# Patient Record
Sex: Female | Born: 1940 | Race: White | Hispanic: No | Marital: Single | Smoking: Never smoker
Health system: Southern US, Community
[De-identification: ages and names within clinical notes are randomized; demographics above are authoritative.]

## PROBLEM LIST (undated history)

## (undated) DIAGNOSIS — F0390 Unspecified dementia without behavioral disturbance: Secondary | ICD-10-CM

---

## 2011-09-30 DIAGNOSIS — D510 Vitamin B12 deficiency anemia due to intrinsic factor deficiency: Secondary | ICD-10-CM | POA: Diagnosis not present

## 2011-10-18 DIAGNOSIS — R7989 Other specified abnormal findings of blood chemistry: Secondary | ICD-10-CM | POA: Diagnosis not present

## 2011-10-18 DIAGNOSIS — I10 Essential (primary) hypertension: Secondary | ICD-10-CM | POA: Diagnosis not present

## 2011-10-18 DIAGNOSIS — D510 Vitamin B12 deficiency anemia due to intrinsic factor deficiency: Secondary | ICD-10-CM | POA: Diagnosis not present

## 2011-10-28 DIAGNOSIS — D510 Vitamin B12 deficiency anemia due to intrinsic factor deficiency: Secondary | ICD-10-CM | POA: Diagnosis not present

## 2011-11-25 DIAGNOSIS — D510 Vitamin B12 deficiency anemia due to intrinsic factor deficiency: Secondary | ICD-10-CM | POA: Diagnosis not present

## 2011-12-27 DIAGNOSIS — D510 Vitamin B12 deficiency anemia due to intrinsic factor deficiency: Secondary | ICD-10-CM | POA: Diagnosis not present

## 2012-02-15 DIAGNOSIS — I10 Essential (primary) hypertension: Secondary | ICD-10-CM | POA: Diagnosis not present

## 2012-02-15 DIAGNOSIS — M549 Dorsalgia, unspecified: Secondary | ICD-10-CM | POA: Diagnosis not present

## 2012-02-15 DIAGNOSIS — Z1231 Encounter for screening mammogram for malignant neoplasm of breast: Secondary | ICD-10-CM | POA: Diagnosis not present

## 2012-02-15 DIAGNOSIS — D510 Vitamin B12 deficiency anemia due to intrinsic factor deficiency: Secondary | ICD-10-CM | POA: Diagnosis not present

## 2012-02-15 DIAGNOSIS — F411 Generalized anxiety disorder: Secondary | ICD-10-CM | POA: Diagnosis not present

## 2012-02-25 DIAGNOSIS — Z1231 Encounter for screening mammogram for malignant neoplasm of breast: Secondary | ICD-10-CM | POA: Diagnosis not present

## 2012-06-16 DIAGNOSIS — Z79899 Other long term (current) drug therapy: Secondary | ICD-10-CM | POA: Diagnosis not present

## 2012-06-16 DIAGNOSIS — M549 Dorsalgia, unspecified: Secondary | ICD-10-CM | POA: Diagnosis not present

## 2012-06-16 DIAGNOSIS — I10 Essential (primary) hypertension: Secondary | ICD-10-CM | POA: Diagnosis not present

## 2012-06-16 DIAGNOSIS — F411 Generalized anxiety disorder: Secondary | ICD-10-CM | POA: Diagnosis not present

## 2012-12-19 DIAGNOSIS — M549 Dorsalgia, unspecified: Secondary | ICD-10-CM | POA: Diagnosis not present

## 2012-12-19 DIAGNOSIS — I10 Essential (primary) hypertension: Secondary | ICD-10-CM | POA: Diagnosis not present

## 2013-06-20 DIAGNOSIS — I10 Essential (primary) hypertension: Secondary | ICD-10-CM | POA: Diagnosis not present

## 2013-06-20 DIAGNOSIS — M549 Dorsalgia, unspecified: Secondary | ICD-10-CM | POA: Diagnosis not present

## 2013-06-20 DIAGNOSIS — L989 Disorder of the skin and subcutaneous tissue, unspecified: Secondary | ICD-10-CM | POA: Diagnosis not present

## 2013-06-20 DIAGNOSIS — E539 Vitamin B deficiency, unspecified: Secondary | ICD-10-CM | POA: Diagnosis not present

## 2013-06-20 DIAGNOSIS — M47817 Spondylosis without myelopathy or radiculopathy, lumbosacral region: Secondary | ICD-10-CM | POA: Diagnosis not present

## 2013-06-20 DIAGNOSIS — R209 Unspecified disturbances of skin sensation: Secondary | ICD-10-CM | POA: Diagnosis not present

## 2013-07-17 DIAGNOSIS — L578 Other skin changes due to chronic exposure to nonionizing radiation: Secondary | ICD-10-CM | POA: Diagnosis not present

## 2013-07-17 DIAGNOSIS — L570 Actinic keratosis: Secondary | ICD-10-CM | POA: Diagnosis not present

## 2013-08-14 DIAGNOSIS — L821 Other seborrheic keratosis: Secondary | ICD-10-CM | POA: Diagnosis not present

## 2013-08-14 DIAGNOSIS — L723 Sebaceous cyst: Secondary | ICD-10-CM | POA: Diagnosis not present

## 2013-08-20 DIAGNOSIS — H2510 Age-related nuclear cataract, unspecified eye: Secondary | ICD-10-CM | POA: Diagnosis not present

## 2013-10-31 DIAGNOSIS — D510 Vitamin B12 deficiency anemia due to intrinsic factor deficiency: Secondary | ICD-10-CM | POA: Diagnosis not present

## 2013-10-31 DIAGNOSIS — I10 Essential (primary) hypertension: Secondary | ICD-10-CM | POA: Diagnosis not present

## 2013-10-31 DIAGNOSIS — M549 Dorsalgia, unspecified: Secondary | ICD-10-CM | POA: Diagnosis not present

## 2013-10-31 DIAGNOSIS — R197 Diarrhea, unspecified: Secondary | ICD-10-CM | POA: Diagnosis not present

## 2013-10-31 DIAGNOSIS — R634 Abnormal weight loss: Secondary | ICD-10-CM | POA: Diagnosis not present

## 2013-11-01 DIAGNOSIS — D510 Vitamin B12 deficiency anemia due to intrinsic factor deficiency: Secondary | ICD-10-CM | POA: Diagnosis not present

## 2013-11-01 DIAGNOSIS — R634 Abnormal weight loss: Secondary | ICD-10-CM | POA: Diagnosis not present

## 2013-11-01 DIAGNOSIS — I10 Essential (primary) hypertension: Secondary | ICD-10-CM | POA: Diagnosis not present

## 2013-11-01 DIAGNOSIS — R197 Diarrhea, unspecified: Secondary | ICD-10-CM | POA: Diagnosis not present

## 2014-01-22 DIAGNOSIS — M549 Dorsalgia, unspecified: Secondary | ICD-10-CM | POA: Diagnosis not present

## 2014-01-22 DIAGNOSIS — R197 Diarrhea, unspecified: Secondary | ICD-10-CM | POA: Diagnosis not present

## 2014-01-22 DIAGNOSIS — I10 Essential (primary) hypertension: Secondary | ICD-10-CM | POA: Diagnosis not present

## 2014-02-14 DIAGNOSIS — K591 Functional diarrhea: Secondary | ICD-10-CM | POA: Diagnosis not present

## 2014-02-14 DIAGNOSIS — Z1211 Encounter for screening for malignant neoplasm of colon: Secondary | ICD-10-CM | POA: Diagnosis not present

## 2014-02-14 DIAGNOSIS — R634 Abnormal weight loss: Secondary | ICD-10-CM | POA: Diagnosis not present

## 2014-03-07 DIAGNOSIS — D129 Benign neoplasm of anus and anal canal: Secondary | ICD-10-CM | POA: Diagnosis not present

## 2014-03-07 DIAGNOSIS — Z79899 Other long term (current) drug therapy: Secondary | ICD-10-CM | POA: Diagnosis not present

## 2014-03-07 DIAGNOSIS — Z1211 Encounter for screening for malignant neoplasm of colon: Secondary | ICD-10-CM | POA: Diagnosis not present

## 2014-03-07 DIAGNOSIS — F329 Major depressive disorder, single episode, unspecified: Secondary | ICD-10-CM | POA: Diagnosis not present

## 2014-03-07 DIAGNOSIS — K591 Functional diarrhea: Secondary | ICD-10-CM | POA: Diagnosis not present

## 2014-03-07 DIAGNOSIS — D128 Benign neoplasm of rectum: Secondary | ICD-10-CM | POA: Diagnosis not present

## 2014-03-07 DIAGNOSIS — I10 Essential (primary) hypertension: Secondary | ICD-10-CM | POA: Diagnosis not present

## 2014-03-07 DIAGNOSIS — D1330 Benign neoplasm of unspecified part of small intestine: Secondary | ICD-10-CM | POA: Diagnosis not present

## 2014-03-07 DIAGNOSIS — D126 Benign neoplasm of colon, unspecified: Secondary | ICD-10-CM | POA: Diagnosis not present

## 2014-03-07 DIAGNOSIS — F3289 Other specified depressive episodes: Secondary | ICD-10-CM | POA: Diagnosis not present

## 2014-04-11 DIAGNOSIS — K591 Functional diarrhea: Secondary | ICD-10-CM | POA: Diagnosis not present

## 2014-04-11 DIAGNOSIS — Z86010 Personal history of colonic polyps: Secondary | ICD-10-CM | POA: Diagnosis not present

## 2014-04-25 DIAGNOSIS — M549 Dorsalgia, unspecified: Secondary | ICD-10-CM | POA: Diagnosis not present

## 2014-04-25 DIAGNOSIS — I10 Essential (primary) hypertension: Secondary | ICD-10-CM | POA: Diagnosis not present

## 2014-07-29 DIAGNOSIS — I10 Essential (primary) hypertension: Secondary | ICD-10-CM | POA: Diagnosis not present

## 2014-07-29 DIAGNOSIS — M549 Dorsalgia, unspecified: Secondary | ICD-10-CM | POA: Diagnosis not present

## 2014-10-28 DIAGNOSIS — I10 Essential (primary) hypertension: Secondary | ICD-10-CM | POA: Diagnosis not present

## 2014-10-28 DIAGNOSIS — M549 Dorsalgia, unspecified: Secondary | ICD-10-CM | POA: Diagnosis not present

## 2014-10-28 DIAGNOSIS — R634 Abnormal weight loss: Secondary | ICD-10-CM | POA: Diagnosis not present

## 2015-01-28 DIAGNOSIS — I10 Essential (primary) hypertension: Secondary | ICD-10-CM | POA: Diagnosis not present

## 2015-01-28 DIAGNOSIS — M549 Dorsalgia, unspecified: Secondary | ICD-10-CM | POA: Diagnosis not present

## 2015-01-28 DIAGNOSIS — D692 Other nonthrombocytopenic purpura: Secondary | ICD-10-CM | POA: Diagnosis not present

## 2015-03-04 DIAGNOSIS — L989 Disorder of the skin and subcutaneous tissue, unspecified: Secondary | ICD-10-CM | POA: Diagnosis not present

## 2015-03-06 DIAGNOSIS — L570 Actinic keratosis: Secondary | ICD-10-CM | POA: Diagnosis not present

## 2015-03-06 DIAGNOSIS — D485 Neoplasm of uncertain behavior of skin: Secondary | ICD-10-CM | POA: Diagnosis not present

## 2015-03-06 DIAGNOSIS — L578 Other skin changes due to chronic exposure to nonionizing radiation: Secondary | ICD-10-CM | POA: Diagnosis not present

## 2015-03-12 DIAGNOSIS — D0439 Carcinoma in situ of skin of other parts of face: Secondary | ICD-10-CM | POA: Diagnosis not present

## 2015-04-11 DIAGNOSIS — C44622 Squamous cell carcinoma of skin of right upper limb, including shoulder: Secondary | ICD-10-CM | POA: Diagnosis not present

## 2015-04-30 DIAGNOSIS — D0461 Carcinoma in situ of skin of right upper limb, including shoulder: Secondary | ICD-10-CM | POA: Diagnosis not present

## 2015-05-07 DIAGNOSIS — R634 Abnormal weight loss: Secondary | ICD-10-CM | POA: Diagnosis not present

## 2015-05-07 DIAGNOSIS — I10 Essential (primary) hypertension: Secondary | ICD-10-CM | POA: Diagnosis not present

## 2015-05-07 DIAGNOSIS — M549 Dorsalgia, unspecified: Secondary | ICD-10-CM | POA: Diagnosis not present

## 2015-06-30 DIAGNOSIS — R233 Spontaneous ecchymoses: Secondary | ICD-10-CM | POA: Diagnosis not present

## 2015-06-30 DIAGNOSIS — L720 Epidermal cyst: Secondary | ICD-10-CM | POA: Diagnosis not present

## 2015-06-30 DIAGNOSIS — L219 Seborrheic dermatitis, unspecified: Secondary | ICD-10-CM | POA: Diagnosis not present

## 2015-06-30 DIAGNOSIS — L578 Other skin changes due to chronic exposure to nonionizing radiation: Secondary | ICD-10-CM | POA: Diagnosis not present

## 2015-06-30 DIAGNOSIS — D0461 Carcinoma in situ of skin of right upper limb, including shoulder: Secondary | ICD-10-CM | POA: Diagnosis not present

## 2015-06-30 DIAGNOSIS — L570 Actinic keratosis: Secondary | ICD-10-CM | POA: Diagnosis not present

## 2015-08-06 DIAGNOSIS — M549 Dorsalgia, unspecified: Secondary | ICD-10-CM | POA: Diagnosis not present

## 2015-08-06 DIAGNOSIS — I10 Essential (primary) hypertension: Secondary | ICD-10-CM | POA: Diagnosis not present

## 2015-11-14 DIAGNOSIS — J0100 Acute maxillary sinusitis, unspecified: Secondary | ICD-10-CM | POA: Diagnosis not present

## 2015-11-14 DIAGNOSIS — M549 Dorsalgia, unspecified: Secondary | ICD-10-CM | POA: Diagnosis not present

## 2015-11-14 DIAGNOSIS — I10 Essential (primary) hypertension: Secondary | ICD-10-CM | POA: Diagnosis not present

## 2015-11-14 DIAGNOSIS — R634 Abnormal weight loss: Secondary | ICD-10-CM | POA: Diagnosis not present

## 2016-02-15 DIAGNOSIS — R4020 Unspecified coma: Secondary | ICD-10-CM | POA: Diagnosis not present

## 2016-02-15 DIAGNOSIS — S0181XA Laceration without foreign body of other part of head, initial encounter: Secondary | ICD-10-CM | POA: Diagnosis not present

## 2016-02-15 DIAGNOSIS — S50811A Abrasion of right forearm, initial encounter: Secondary | ICD-10-CM | POA: Diagnosis not present

## 2016-02-15 DIAGNOSIS — S0081XA Abrasion of other part of head, initial encounter: Secondary | ICD-10-CM | POA: Diagnosis not present

## 2016-02-15 DIAGNOSIS — S0990XA Unspecified injury of head, initial encounter: Secondary | ICD-10-CM | POA: Diagnosis not present

## 2016-02-15 DIAGNOSIS — R259 Unspecified abnormal involuntary movements: Secondary | ICD-10-CM | POA: Diagnosis not present

## 2016-03-16 DIAGNOSIS — E538 Deficiency of other specified B group vitamins: Secondary | ICD-10-CM | POA: Diagnosis not present

## 2016-03-16 DIAGNOSIS — I10 Essential (primary) hypertension: Secondary | ICD-10-CM | POA: Diagnosis not present

## 2016-03-16 DIAGNOSIS — R51 Headache: Secondary | ICD-10-CM | POA: Diagnosis not present

## 2016-03-16 DIAGNOSIS — M549 Dorsalgia, unspecified: Secondary | ICD-10-CM | POA: Diagnosis not present

## 2016-03-16 DIAGNOSIS — R634 Abnormal weight loss: Secondary | ICD-10-CM | POA: Diagnosis not present

## 2016-10-03 DIAGNOSIS — S299XXA Unspecified injury of thorax, initial encounter: Secondary | ICD-10-CM | POA: Diagnosis not present

## 2016-10-03 DIAGNOSIS — S81811A Laceration without foreign body, right lower leg, initial encounter: Secondary | ICD-10-CM | POA: Diagnosis not present

## 2016-12-31 DIAGNOSIS — F0390 Unspecified dementia without behavioral disturbance: Secondary | ICD-10-CM | POA: Diagnosis not present

## 2016-12-31 DIAGNOSIS — R402441 Other coma, without documented Glasgow coma scale score, or with partial score reported, in the field [EMT or ambulance]: Secondary | ICD-10-CM | POA: Diagnosis not present

## 2017-02-22 DIAGNOSIS — R402441 Other coma, without documented Glasgow coma scale score, or with partial score reported, in the field [EMT or ambulance]: Secondary | ICD-10-CM | POA: Diagnosis not present

## 2017-02-22 DIAGNOSIS — N3000 Acute cystitis without hematuria: Secondary | ICD-10-CM | POA: Diagnosis not present

## 2017-02-22 DIAGNOSIS — F0390 Unspecified dementia without behavioral disturbance: Secondary | ICD-10-CM | POA: Diagnosis not present

## 2017-02-22 DIAGNOSIS — R4182 Altered mental status, unspecified: Secondary | ICD-10-CM | POA: Diagnosis not present

## 2017-02-22 DIAGNOSIS — R531 Weakness: Secondary | ICD-10-CM | POA: Diagnosis not present

## 2017-03-11 ENCOUNTER — Emergency Department (HOSPITAL_COMMUNITY): Payer: Medicare Other

## 2017-03-11 ENCOUNTER — Inpatient Hospital Stay (HOSPITAL_COMMUNITY)
Admission: EM | Admit: 2017-03-11 | Discharge: 2017-03-18 | DRG: 064 | Disposition: A | Discharge status: Skilled Nursing Facility | Payer: Medicare Other | Attending: Family Medicine | Admitting: Family Medicine

## 2017-03-11 ENCOUNTER — Encounter (HOSPITAL_COMMUNITY): Payer: Self-pay

## 2017-03-11 DIAGNOSIS — Z66 Do not resuscitate: Secondary | ICD-10-CM | POA: Diagnosis not present

## 2017-03-11 DIAGNOSIS — F0390 Unspecified dementia without behavioral disturbance: Secondary | ICD-10-CM | POA: Diagnosis present

## 2017-03-11 DIAGNOSIS — R402352 Coma scale, best motor response, localizes pain, at arrival to emergency department: Secondary | ICD-10-CM | POA: Diagnosis present

## 2017-03-11 DIAGNOSIS — G464 Cerebellar stroke syndrome: Secondary | ICD-10-CM | POA: Diagnosis not present

## 2017-03-11 DIAGNOSIS — D72829 Elevated white blood cell count, unspecified: Secondary | ICD-10-CM | POA: Diagnosis not present

## 2017-03-11 DIAGNOSIS — E860 Dehydration: Secondary | ICD-10-CM | POA: Diagnosis not present

## 2017-03-11 DIAGNOSIS — R402232 Coma scale, best verbal response, inappropriate words, at arrival to emergency department: Secondary | ICD-10-CM | POA: Diagnosis present

## 2017-03-11 DIAGNOSIS — G8194 Hemiplegia, unspecified affecting left nondominant side: Secondary | ICD-10-CM | POA: Diagnosis present

## 2017-03-11 DIAGNOSIS — I638 Other cerebral infarction: Principal | ICD-10-CM | POA: Diagnosis present

## 2017-03-11 DIAGNOSIS — R29714 NIHSS score 14: Secondary | ICD-10-CM | POA: Diagnosis present

## 2017-03-11 DIAGNOSIS — L538 Other specified erythematous conditions: Secondary | ICD-10-CM | POA: Diagnosis present

## 2017-03-11 DIAGNOSIS — I498 Other specified cardiac arrhythmias: Secondary | ICD-10-CM | POA: Diagnosis not present

## 2017-03-11 DIAGNOSIS — R9401 Abnormal electroencephalogram [EEG]: Secondary | ICD-10-CM | POA: Diagnosis not present

## 2017-03-11 DIAGNOSIS — I10 Essential (primary) hypertension: Secondary | ICD-10-CM | POA: Diagnosis present

## 2017-03-11 DIAGNOSIS — I4891 Unspecified atrial fibrillation: Secondary | ICD-10-CM | POA: Diagnosis not present

## 2017-03-11 DIAGNOSIS — Z515 Encounter for palliative care: Secondary | ICD-10-CM

## 2017-03-11 DIAGNOSIS — I6330 Cerebral infarction due to thrombosis of unspecified cerebral artery: Secondary | ICD-10-CM | POA: Insufficient documentation

## 2017-03-11 DIAGNOSIS — G92 Toxic encephalopathy: Secondary | ICD-10-CM | POA: Diagnosis not present

## 2017-03-11 DIAGNOSIS — E876 Hypokalemia: Secondary | ICD-10-CM | POA: Diagnosis present

## 2017-03-11 DIAGNOSIS — I739 Peripheral vascular disease, unspecified: Secondary | ICD-10-CM | POA: Diagnosis present

## 2017-03-11 DIAGNOSIS — M79602 Pain in left arm: Secondary | ICD-10-CM | POA: Diagnosis not present

## 2017-03-11 DIAGNOSIS — R4781 Slurred speech: Secondary | ICD-10-CM | POA: Diagnosis present

## 2017-03-11 DIAGNOSIS — Z781 Physical restraint status: Secondary | ICD-10-CM

## 2017-03-11 DIAGNOSIS — R633 Feeding difficulties: Secondary | ICD-10-CM | POA: Diagnosis present

## 2017-03-11 DIAGNOSIS — R159 Full incontinence of feces: Secondary | ICD-10-CM | POA: Diagnosis present

## 2017-03-11 DIAGNOSIS — R32 Unspecified urinary incontinence: Secondary | ICD-10-CM | POA: Diagnosis present

## 2017-03-11 DIAGNOSIS — N179 Acute kidney failure, unspecified: Secondary | ICD-10-CM | POA: Diagnosis present

## 2017-03-11 DIAGNOSIS — F1010 Alcohol abuse, uncomplicated: Secondary | ICD-10-CM | POA: Diagnosis present

## 2017-03-11 DIAGNOSIS — X58XXXA Exposure to other specified factors, initial encounter: Secondary | ICD-10-CM | POA: Diagnosis present

## 2017-03-11 DIAGNOSIS — M79662 Pain in left lower leg: Secondary | ICD-10-CM | POA: Diagnosis present

## 2017-03-11 DIAGNOSIS — R402142 Coma scale, eyes open, spontaneous, at arrival to emergency department: Secondary | ICD-10-CM | POA: Diagnosis present

## 2017-03-11 DIAGNOSIS — I639 Cerebral infarction, unspecified: Secondary | ICD-10-CM | POA: Diagnosis not present

## 2017-03-11 DIAGNOSIS — R531 Weakness: Secondary | ICD-10-CM | POA: Diagnosis not present

## 2017-03-11 DIAGNOSIS — R471 Dysarthria and anarthria: Secondary | ICD-10-CM | POA: Diagnosis present

## 2017-03-11 DIAGNOSIS — R4182 Altered mental status, unspecified: Secondary | ICD-10-CM | POA: Diagnosis not present

## 2017-03-11 DIAGNOSIS — I6789 Other cerebrovascular disease: Secondary | ICD-10-CM | POA: Diagnosis not present

## 2017-03-11 DIAGNOSIS — R569 Unspecified convulsions: Secondary | ICD-10-CM | POA: Diagnosis not present

## 2017-03-11 DIAGNOSIS — R410 Disorientation, unspecified: Secondary | ICD-10-CM | POA: Diagnosis not present

## 2017-03-11 DIAGNOSIS — I480 Paroxysmal atrial fibrillation: Secondary | ICD-10-CM | POA: Diagnosis present

## 2017-03-11 DIAGNOSIS — M6282 Rhabdomyolysis: Secondary | ICD-10-CM | POA: Diagnosis not present

## 2017-03-11 DIAGNOSIS — M6281 Muscle weakness (generalized): Secondary | ICD-10-CM | POA: Diagnosis not present

## 2017-03-11 DIAGNOSIS — M25512 Pain in left shoulder: Secondary | ICD-10-CM | POA: Diagnosis present

## 2017-03-11 DIAGNOSIS — S0012XA Contusion of left eyelid and periocular area, initial encounter: Secondary | ICD-10-CM | POA: Diagnosis present

## 2017-03-11 HISTORY — DX: Unspecified dementia, unspecified severity, without behavioral disturbance, psychotic disturbance, mood disturbance, and anxiety: F03.90

## 2017-03-11 LAB — RAPID URINE DRUG SCREEN, HOSP PERFORMED
AMPHETAMINES: NOT DETECTED
BENZODIAZEPINES: NOT DETECTED
Barbiturates: NOT DETECTED
COCAINE: NOT DETECTED
OPIATES: NOT DETECTED
Tetrahydrocannabinol: NOT DETECTED

## 2017-03-11 LAB — COMPREHENSIVE METABOLIC PANEL
ALT: 33 U/L (ref 14–54)
ANION GAP: 14 (ref 5–15)
AST: 70 U/L — ABNORMAL HIGH (ref 15–41)
Albumin: 3.9 g/dL (ref 3.5–5.0)
Alkaline Phosphatase: 68 U/L (ref 38–126)
BUN: 26 mg/dL — ABNORMAL HIGH (ref 6–20)
CHLORIDE: 102 mmol/L (ref 101–111)
CO2: 23 mmol/L (ref 22–32)
Calcium: 9 mg/dL (ref 8.9–10.3)
Creatinine, Ser: 1.31 mg/dL — ABNORMAL HIGH (ref 0.44–1.00)
GFR calc non Af Amer: 38 mL/min — ABNORMAL LOW (ref >60)
GFR, EST AFRICAN AMERICAN: 44 mL/min — AB (ref >60)
Glucose, Bld: 116 mg/dL — ABNORMAL HIGH (ref 65–99)
Potassium: 4.6 mmol/L (ref 3.5–5.1)
SODIUM: 139 mmol/L (ref 135–145)
Total Bilirubin: 2 mg/dL — ABNORMAL HIGH (ref 0.3–1.2)
Total Protein: 7.4 g/dL (ref 6.5–8.1)

## 2017-03-11 LAB — URINALYSIS, ROUTINE W REFLEX MICROSCOPIC
BACTERIA UA: NONE SEEN
Bilirubin Urine: NEGATIVE
Glucose, UA: NEGATIVE mg/dL
KETONES UR: 20 mg/dL — AB
Leukocytes, UA: NEGATIVE
Nitrite: NEGATIVE
PROTEIN: NEGATIVE mg/dL
Specific Gravity, Urine: 1.024 (ref 1.005–1.030)
pH: 5 (ref 5.0–8.0)

## 2017-03-11 LAB — DIFFERENTIAL
BASOS PCT: 0 %
Basophils Absolute: 0 10*3/uL (ref 0.0–0.1)
EOS PCT: 0 %
Eosinophils Absolute: 0 10*3/uL (ref 0.0–0.7)
Lymphocytes Relative: 6 %
Lymphs Abs: 0.8 10*3/uL (ref 0.7–4.0)
MONO ABS: 1.3 10*3/uL — AB (ref 0.1–1.0)
Monocytes Relative: 9 %
NEUTROS ABS: 11.5 10*3/uL — AB (ref 1.7–7.7)
Neutrophils Relative %: 85 %

## 2017-03-11 LAB — CBC
HCT: 44.8 % (ref 36.0–46.0)
Hemoglobin: 14.6 g/dL (ref 12.0–15.0)
MCH: 29.1 pg (ref 26.0–34.0)
MCHC: 32.6 g/dL (ref 30.0–36.0)
MCV: 89.2 fL (ref 78.0–100.0)
PLATELETS: 299 10*3/uL (ref 150–400)
RBC: 5.02 MIL/uL (ref 3.87–5.11)
RDW: 14.2 % (ref 11.5–15.5)
WBC: 13.5 10*3/uL — AB (ref 4.0–10.5)

## 2017-03-11 LAB — I-STAT TROPONIN, ED: Troponin i, poc: 0.01 ng/mL (ref 0.00–0.08)

## 2017-03-11 LAB — APTT: aPTT: 34 seconds (ref 24–36)

## 2017-03-11 LAB — PROTIME-INR
INR: 0.98
PROTHROMBIN TIME: 12.9 s (ref 11.4–15.2)

## 2017-03-11 LAB — ABO/RH: ABO/RH(D): O POS

## 2017-03-11 LAB — MRSA PCR SCREENING: MRSA BY PCR: NEGATIVE

## 2017-03-11 LAB — CK: Total CK: 3142 U/L — ABNORMAL HIGH (ref 38–234)

## 2017-03-11 LAB — TYPE AND SCREEN
ABO/RH(D): O POS
ANTIBODY SCREEN: NEGATIVE

## 2017-03-11 LAB — ETHANOL

## 2017-03-11 MED ORDER — SODIUM CHLORIDE 0.9 % IV SOLN
1000.0000 mL | INTRAVENOUS | Status: DC
  Administered 2017-03-11 – 2017-03-12 (×2): 1000 mL via INTRAVENOUS

## 2017-03-11 MED ORDER — SODIUM CHLORIDE 0.9 % IV BOLUS (SEPSIS)
1000.0000 mL | Freq: Once | INTRAVENOUS | Status: AC
  Administered 2017-03-11: 1000 mL via INTRAVENOUS

## 2017-03-11 NOTE — H&P (Signed)
[facility] Admission History and Physical Service Pager: [phone]  Patient name: Susan Ryan Medical record number: [id number] Date of birth: Dec 30, 1940 Age: 76 y.o. Gender: female  Primary Care Provider: Patient, No Pcp Per Consultants: neurology Code Status: FULL per family on admission  Chief Complaint: AMS  Assessment and Plan: Susan Ryan is a 76 y.o. female presenting with AMS, having been found confused in . PMH is significant for afib.   AMS: etiology unclear, lives alone relatively unsupervised. Moderate to severe dementia. Possible causes for acute loss of consciousness include stroke, seizure, overdose, intoxication, hypotension, medications,infection. Initially the workup revealed head CT with no acute findings but atrophy, ingestion and overdose labs negative, lab suggestive of moderate rhabdomyolysis with elevation in creatinine to 1.31, AST 70, and hemoglobin in her urinalysis. White blood cell count elevated to 13.5, however no previous values, no other signs or symptoms of infection. We'll not treat with antibiotics for now, watch clinically and consider adding if mental status not resolving and no other sources are found. -admit to inpatient, Dr. Ardelia Mems attending  -Consult neurology for possible stroke workup, appreciate recs: MRI and EEG ordered -delirium precautions -Stepdown unit -Every 2 neurochecks  Dementia: patient's dementia is moderate to severe given that she is no longer able to do any ADLs, and requires extensive coaching and hand beginning to eat meals. Suspect prognosis is certainly less than 6 months, attempted to discuss this with family, and son was tearful and reticent. Informed son that outpatient or inpatient hospice may give some resources to accomplish his goal for her dying at home. Explained what a code situation was, and that the patient has a grim prognosis showed a code situation arise, however son seemed overwhelmed  and elected to continue with full code at present with plans to discuss this more. -Delirium precautions -Nothing by mouth pending SLP eval -PT/OT  Afib: patient unable to give past medical history, and son with limited health literacy. Unsure whether patient is on anticoagulation. Rate controlled without any meds. Embolic stroke is certainly a possible source of altered mental status in this patient. -Attempt to obtain further history in a.m.  Skin breakdown: patient with erythema over genital area, likely due to fecal incontinence as well as sedentary lifestyle. -Wound consult  FEN/GI: nothing by mouth pending SLP Prophylaxis: heparin subcutaneous as head CT negative for acute bleed  Disposition: admit to stepdown  History of Present Illness:  Susan Ryan is a 76 y.o. female presenting with acute altered mental status in the setting of severe underlying dementia. Past medical history includes A. Fib and dementia. HPI per son and granddaughter. Husband passed this last December after dementia. At baseline, she talks to people were not present, visual hallucinations; this is worse recently. Son has noticed memory troubles for at least 2-3 years. She lives in the house alone now. She does not drive. Sometimes she calls her son her husband's name. Can't feed herself or dress herself. Eats only with prompting. Last several days she hasn't come out on the porch like she normally does. Candis Musa a lot more over the last 2 weeks. Last seen normal about 5pm yesterday. Found her about noon today slumped over in the chair. Rhythmic pulling on the cable cord with her left hand today after family initially thought she could not move this. No lip smacking. Son found her top teeth on the floor. DIL tried to move her and she started screaming as if she was in pain. Recently started on  seroquel from another ED (son thinks maybe for behavior) last dosed seroquel day before yesterday. Found her in stool today. She has  incontinence at baseline x several months. Never found her slumped over before.   Review Of Systems: Per HPI with the following additions: patient unable to provide any history due to altered mental status  ROS  Patient Active Problem List   Diagnosis Date Noted  . Altered mental status 03/11/2017    Past Medical History: Past Medical History:  Diagnosis Date  . Dementia     Past Surgical History: History reviewed. No pertinent surgical history.  Social History: Social History  Substance Use Topics  . Smoking status: Never Smoker  . Smokeless tobacco: Never Used  . Alcohol use No   Additional social history: [place], son lives nearby and checks on her multiple times per day.  Please also refer to relevant sections of EMR.  Family History: History reviewed. No pertinent family history. Husband recently passed of dementia  Allergies and Medications: No Known Allergies No current facility-administered medications on file prior to encounter.    No current outpatient prescriptions on file prior to encounter.    Objective: BP (!) 142/83   Pulse 86   Temp 97.9 F (36.6 C) (Oral)   Resp (!) 22   Ht 5\' 2"  (1.575 m) Comment: per family  Wt 99 lb 6.8 oz (45.1 kg)   SpO2 97%   BMI 18.19 kg/m  Exam: General: cachectic female, opens eyes to verbal stimuli Eyes: EOMI ENTM: dry MM, patient unable to cooperate Neck: supple Cardiovascular: irregular rhythm, normal rate, no murmur Respiratory: CTAB, easy WOB  Gastrointestinal: SNTND, +BS MSK: spontaneously moves all extremities Derm: erythema without open wounds over perineum and buttocks Neuro: unable to assess - patient opens eyes to verbal stimuli, attempts to squeeze finger Psych: unable to assess  Labs and Imaging: CBC BMET   Recent Labs Lab 03/11/17 1722  WBC 13.5*  HGB 14.6  HCT 44.8  PLT 299    Recent Labs Lab 03/11/17 1722  NA 139  K 4.6  CL 102  CO2 23  BUN 26*   CREATININE 1.31*  GLUCOSE 116*  CALCIUM 9.0     UDS neg. Blood culture x 2 ordered. Ethanol neg. INR 0.98, WBC 13.5, Hgb 14.6, Na 139, K 4.6, BUN 26, Cr 1.31, AST 70, ALT 33, tbili 2.0, CK 3142, trop 0.01, UA amber, w Hgb, ketones, squams. CXR poor study nothing acute. CT head with atrophy, nothing acute. EKG afib with rate 88.   Sela Hilding, MD 03/12/2017, 1:18 AM PGY-1, [facility] pager: [phone], text pages welcome

## 2017-03-11 NOTE — ED Provider Notes (Signed)
[facility] DEPT Provider Note   CSN: [id number] Arrival date & time: 03/11/17  1540   By signing my name below, I, Susan Ryan, attest that this documentation has been prepared under the direction and in the presence of Dorie Rank, MD. Electronically signed, Susan Ryan, ED Scribe. 03/11/17. 3:56 PM.   History   Chief Complaint Chief Complaint  Patient presents with  . Stroke Symptoms   LEVEL 5 CAVEAT: HPI and ROS limited due to dementia   The history is provided by medical records and the EMS personnel (Nursing staff in the ED). No language interpreter was used.    Susan Ryan is a 76 y.o. 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 Triage states: "LSN last night at 1700. Pt completely flaccid on the left side. Pt has dementia at baseline. Currently taking cipro, not sure what for. Pt covered in feces and vomit on arrival." Family unavailable for history; nursing staff states the pt lives on the same property in a separate house from family. Pt responds to verbal and physical stimuli and commands in a low, muffled grumble.  Past Medical History:  Diagnosis Date  . Dementia     There are no active problems to display for this patient.   No past surgical history on file.  OB History    No data available       Home Medications    Prior to Admission medications   Not on File    Family History No family history on file.  Social History Social History  Substance Use Topics  . Smoking status: Not on file  . Smokeless tobacco: Not on file  . Alcohol use Not on file     Allergies   Patient has no known allergies.   Review of Systems Review of Systems  Unable to perform ROS: Dementia     Physical Exam Updated Vital Signs BP (!) 151/56   Pulse 78   Temp 100.1 F (37.8 C) (Rectal)   Resp 18   SpO2 100%   Physical Exam  Constitutional: No distress.  Frail, elderly, disheveled,  poor hygeine  HENT:  Head: Normocephalic and atraumatic.  Right Ear: External ear normal.  Left Ear: External ear normal.  Mm dry  Eyes: Conjunctivae are normal. Right eye exhibits no discharge. Left eye exhibits no discharge. No scleral icterus.  Neck: Neck supple. No tracheal deviation present.  Cardiovascular: Normal rate, regular rhythm and intact distal pulses.   Pulmonary/Chest: Effort normal and breath sounds normal. No stridor. No respiratory distress. She has no wheezes. She has no rales.  Abdominal: Soft. Bowel sounds are normal. She exhibits no distension. There is no tenderness. There is no rebound and no guarding.  Musculoskeletal: She exhibits no edema or tenderness.  Neurological: She is alert. No cranial nerve deficit (no facial droop, ) or sensory deficit. She exhibits normal muscle tone. She displays no seizure activity. GCS eye subscore is 4. GCS verbal subscore is 3. GCS motor subscore is 5.  Pt mumbles, not speaking clearly, hard to understand although does answer to voice, generalized weakness, does not follow commands to move extremities  Skin: Skin is warm and dry. No rash noted.  Covered in stool, erythematous rash in the perineum, superficial skin breakdown  Psychiatric: She has a normal mood and affect.  Nursing note and vitals reviewed.    ED Treatments / Results  DIAGNOSTIC STUDIES: Oxygen Saturation is 98% on RA,  NL by my interpretation.    COORDINATION OF CARE: 3:52 PM-Will review records and form care plan.   Labs (all labs ordered are listed, but only abnormal results are displayed) Labs Reviewed  CBC - Abnormal; Notable for the following:       Result Value   WBC 13.5 (*)    All other components within normal limits  DIFFERENTIAL - Abnormal; Notable for the following:    Neutro Abs 11.5 (*)    Monocytes Absolute 1.3 (*)    All other components within normal limits  COMPREHENSIVE METABOLIC PANEL - Abnormal; Notable for the following:     Glucose, Bld 116 (*)    BUN 26 (*)    Creatinine, Ser 1.31 (*)    AST 70 (*)    Total Bilirubin 2.0 (*)    GFR calc non Af Amer 38 (*)    GFR calc Af Amer 44 (*)    All other components within normal limits  URINALYSIS, ROUTINE W REFLEX MICROSCOPIC - Abnormal; Notable for the following:    Color, Urine AMBER (*)    Hgb urine dipstick SMALL (*)    Ketones, ur 20 (*)    Squamous Epithelial / LPF 0-5 (*)    All other components within normal limits  CK - Abnormal; Notable for the following:    Total CK 3,142 (*)    All other components within normal limits  URINE CULTURE  CULTURE, BLOOD (ROUTINE X 2)  CULTURE, BLOOD (ROUTINE X 2)  ETHANOL  PROTIME-INR  APTT  RAPID URINE DRUG SCREEN, HOSP PERFORMED  I-STAT TROPOININ, ED  TYPE AND SCREEN  ABO/RH    EKG  EKG Interpretation  Date/Time:  Friday March 11 2017 17:03:14 EDT Ventricular Rate:  88 PR Interval:    QRS Duration: 84 QT Interval:  393 QTC Calculation: 476 R Axis:   53 Text Interpretation:  sinus rhythm Nonspecific T abnormalities, anterior leads No old tracing to compare Reconfirmed by Dorie Rank [phone]) on 03/11/2017 5:30:43 PM       Radiology Ct Head Wo Contrast  Result Date: 03/11/2017 CLINICAL DATA:  Left-sided weakness concerning for stroke. EXAM: CT HEAD WITHOUT CONTRAST TECHNIQUE: Contiguous axial images were obtained from the base of the skull through the vertex without intravenous contrast. COMPARISON:  None. FINDINGS: Brain: Mild diffuse cortical atrophy is noted. Mild chronic ischemic white matter disease is noted. No mass effect or midline shift is noted. Ventricular size is within normal limits. There is no evidence of mass lesion, hemorrhage or acute infarction. Vascular: No hyperdense vessel or unexpected calcification. Skull: Normal. Negative for fracture or focal lesion. Sinuses/Orbits: No acute finding. Other: None. IMPRESSION: Mild diffuse cortical atrophy. Mild chronic ischemic white matter disease. No  acute intracranial abnormality seen. Electronically Signed   By: Marijo Conception, M.D.   On: 03/11/2017 16:33   Dg Chest Portable 1 View  Result Date: 03/11/2017 CLINICAL DATA:  Weakness EXAM: PORTABLE CHEST 1 VIEW COMPARISON:  None. FINDINGS: The patient is rotated significantly to the left which limits the study. Suspect mild cardiomegaly. No definite confluent opacity, effusion or edema. No acute bony abnormality. IMPRESSION: Study limited by patient rotation. Cardiomegaly. No definite acute process. Electronically Signed   By: Rolm Baptise M.D.   On: 03/11/2017 17:41    Procedures Procedures (including critical care time)  Medications Ordered in ED Medications  sodium chloride 0.9 % bolus 1,000 mL (1,000 mLs Intravenous New Bag/Given 03/11/17 1751)    Followed by  0.9 %  sodium chloride infusion (not administered)     Initial Impression / Assessment and Plan / ED Course  I have reviewed the triage vital signs and the nursing notes.  Pertinent labs & imaging results that were available during my care of the patient were reviewed by me and considered in my medical decision making (see chart for details).  Clinical Course as of Mar 11 1853  Fri Mar 11, 2017  1724 EKG   [JK]    Clinical Course User Index [JK] Dorie Rank, MD    Pt presented with confusion, weakness.  Unclear what her baseline is.   Pt was covered in stool and EMS was concerned about her living situation.  Labs notable for elevated CK.  Consistent with rhabdo.  No definite infection found.  Concerned about the possibility of stroke.  Will consult with medical service for admission, further evaluation.  Final Clinical Impressions(s) / ED Diagnoses   Final diagnoses:  Dehydration  Non-traumatic rhabdomyolysis  Weakness    I personally performed the services described in this documentation, which was scribed in my presence.  The recorded information has been reviewed and is accurate.    Dorie Rank, MD 03/11/17  [phone]

## 2017-03-11 NOTE — ED Notes (Signed)
Admitting Provider at bedside. 

## 2017-03-11 NOTE — Plan of Care (Addendum)
Problem: Education: Goal: Knowledge of [facility] Education information/materials will improve Outcome: Progressing Discussed with patient (with no teach back displayed at this time) and with family (with some teach back displayed)  About plan of care and labs.  Topics: Purewick, IVF, Mouth care, Skin care and lab values

## 2017-03-11 NOTE — Consult Note (Signed)
Reason for Consult: Syncope Referring Physician: Dr. McIntyre  Susan Ryan is an 76 y.o. female.  HPI:  Patient has a history of dementia but living alone at home.  She was found yesterday at 5 pm slumped over the left side onto a chair.  It is not clear how long she had been doing that, but she has rhadbomyolysis with renal failure.  CT Brain shows global atrophy and small vessel ischemic disease.  CXR is normal.  CK 3142, GFR 40,  WBC 13.5 and cultures pending.    Past Medical History:  Diagnosis Date  . Dementia     No past surgical history on file.  No family history on file.  Social History:  has no tobacco, alcohol, and drug history on file.  Allergies: No Known Allergies  Prior to Admission medications   Not on File    Medications: Scheduled:   Results for orders placed or performed during the hospital encounter of 03/11/17 (from the past 48 hour(s))  Urine rapid drug screen (hosp performed)not at [facility]     Status: None   Collection Time: 03/11/17  3:51 PM  Result Value Ref Range   Opiates NONE DETECTED NONE DETECTED   Cocaine NONE DETECTED NONE DETECTED   Benzodiazepines NONE DETECTED NONE DETECTED   Amphetamines NONE DETECTED NONE DETECTED   Tetrahydrocannabinol NONE DETECTED NONE DETECTED   Barbiturates NONE DETECTED NONE DETECTED    Comment:        DRUG SCREEN FOR MEDICAL PURPOSES ONLY.  IF CONFIRMATION IS NEEDED FOR ANY PURPOSE, NOTIFY LAB WITHIN 5 DAYS.        LOWEST DETECTABLE LIMITS FOR URINE DRUG SCREEN Drug Class       Cutoff (ng/mL) Amphetamine      1000 Barbiturate      200 Benzodiazepine   200 Tricyclics       300 Opiates          300 Cocaine          300 THC              50   Ethanol     Status: None   Collection Time: 03/11/17  5:22 PM  Result Value Ref Range   Alcohol, Ethyl (B) <5 <5 mg/dL    Comment:        LOWEST DETECTABLE LIMIT FOR SERUM ALCOHOL IS 5 mg/dL FOR MEDICAL PURPOSES ONLY   Protime-INR     Status: None   Collection  Time: 03/11/17  5:22 PM  Result Value Ref Range   Prothrombin Time 12.9 11.4 - 15.2 seconds   INR 0.98   APTT     Status: None   Collection Time: 03/11/17  5:22 PM  Result Value Ref Range   aPTT 34 24 - 36 seconds  CBC     Status: Abnormal   Collection Time: 03/11/17  5:22 PM  Result Value Ref Range   WBC 13.5 (H) 4.0 - 10.5 K/uL   RBC 5.02 3.87 - 5.11 MIL/uL   Hemoglobin 14.6 12.0 - 15.0 g/dL   HCT 44.8 36.0 - 46.0 %   MCV 89.2 78.0 - 100.0 fL   MCH 29.1 26.0 - 34.0 pg   MCHC 32.6 30.0 - 36.0 g/dL   RDW 14.2 11.5 - 15.5 %   Platelets 299 150 - 400 K/uL  Differential     Status: Abnormal   Collection Time: 03/11/17  5:22 PM  Result Value Ref Range   Neutrophils Relative % 85 %     Neutro Abs 11.5 (H) 1.7 - 7.7 K/uL   Lymphocytes Relative 6 %   Lymphs Abs 0.8 0.7 - 4.0 K/uL   Monocytes Relative 9 %   Monocytes Absolute 1.3 (H) 0.1 - 1.0 K/uL   Eosinophils Relative 0 %   Eosinophils Absolute 0.0 0.0 - 0.7 K/uL   Basophils Relative 0 %   Basophils Absolute 0.0 0.0 - 0.1 K/uL  Comprehensive metabolic panel     Status: Abnormal   Collection Time: 03/11/17  5:22 PM  Result Value Ref Range   Sodium 139 135 - 145 mmol/L   Potassium 4.6 3.5 - 5.1 mmol/L   Chloride 102 101 - 111 mmol/L   CO2 23 22 - 32 mmol/L   Glucose, Bld 116 (H) 65 - 99 mg/dL   BUN 26 (H) 6 - 20 mg/dL   Creatinine, Ser 1.31 (H) 0.44 - 1.00 mg/dL   Calcium 9.0 8.9 - 10.3 mg/dL   Total Protein 7.4 6.5 - 8.1 g/dL   Albumin 3.9 3.5 - 5.0 g/dL   AST 70 (H) 15 - 41 U/L   ALT 33 14 - 54 U/L   Alkaline Phosphatase 68 38 - 126 U/L   Total Bilirubin 2.0 (H) 0.3 - 1.2 mg/dL   GFR calc non Af Amer 38 (L) >60 mL/min   GFR calc Af Amer 44 (L) >60 mL/min    Comment: (NOTE) The eGFR has been calculated using the CKD EPI equation. This calculation has not been validated in all clinical situations. eGFR's persistently <60 mL/min signify possible Chronic Kidney Disease.    Anion gap 14 5 - 15  Type and screen [facility]     Status: None   Collection Time: 03/11/17  5:22 PM  Result Value Ref Range   ABO/RH(D) O POS    Antibody Screen NEG    Sample Expiration 03/14/2017   CK     Status: Abnormal   Collection Time: 03/11/17  5:22 PM  Result Value Ref Range   Total CK 3,142 (H) 38 - 234 U/L  ABO/Rh     Status: None   Collection Time: 03/11/17  5:22 PM  Result Value Ref Range   ABO/RH(D) O POS   I-stat troponin, ED (not at [facility], [facility])     Status: None   Collection Time: 03/11/17  5:33 PM  Result Value Ref Range   Troponin i, poc 0.01 0.00 - 0.08 ng/mL   Comment 3            Comment: Due to the release kinetics of cTnI, a negative result within the first hours of the onset of symptoms does not rule out myocardial infarction with certainty. If myocardial infarction is still suspected, repeat the test at appropriate intervals.   Urinalysis, Routine w reflex microscopic (not at [facility])     Status: Abnormal   Collection Time: 03/11/17  5:58 PM  Result Value Ref Range   Color, Urine AMBER (A) YELLOW    Comment: BIOCHEMICALS MAY BE AFFECTED BY COLOR   APPearance CLEAR CLEAR   Specific Gravity, Urine 1.024 1.005 - 1.030   pH 5.0 5.0 - 8.0   Glucose, UA NEGATIVE NEGATIVE mg/dL   Hgb urine dipstick SMALL (A) NEGATIVE   Bilirubin Urine NEGATIVE NEGATIVE   Ketones, ur 20 (A) NEGATIVE mg/dL   Protein, ur NEGATIVE NEGATIVE mg/dL   Nitrite NEGATIVE NEGATIVE   Leukocytes, UA NEGATIVE NEGATIVE   RBC / HPF 0-5 0 - 5 RBC/hpf     WBC, UA 0-5 0 - 5 WBC/hpf   Bacteria, UA NONE SEEN NONE SEEN   Squamous Epithelial / LPF 0-5 (A) NONE SEEN   Mucous PRESENT    Hyaline Casts, UA PRESENT     Ct Head Wo Contrast  Result Date: 03/11/2017 CLINICAL DATA:  Left-sided weakness concerning for stroke. EXAM: CT HEAD WITHOUT CONTRAST TECHNIQUE: Contiguous axial images were obtained from the base of the skull through the vertex without intravenous contrast. COMPARISON:  None. FINDINGS: Brain: Mild diffuse  cortical atrophy is noted. Mild chronic ischemic white matter disease is noted. No mass effect or midline shift is noted. Ventricular size is within normal limits. There is no evidence of mass lesion, hemorrhage or acute infarction. Vascular: No hyperdense vessel or unexpected calcification. Skull: Normal. Negative for fracture or focal lesion. Sinuses/Orbits: No acute finding. Other: None. IMPRESSION: Mild diffuse cortical atrophy. Mild chronic ischemic white matter disease. No acute intracranial abnormality seen. Electronically Signed   By: James  Green Jr, M.D.   On: 03/11/2017 16:33   Dg Chest Portable 1 View  Result Date: 03/11/2017 CLINICAL DATA:  Weakness EXAM: PORTABLE CHEST 1 VIEW COMPARISON:  None. FINDINGS: The patient is rotated significantly to the left which limits the study. Suspect mild cardiomegaly. No definite confluent opacity, effusion or edema. No acute bony abnormality. IMPRESSION: Study limited by patient rotation. Cardiomegaly. No definite acute process. Electronically Signed   By: Kevin  Dover M.D.   On: 03/11/2017 17:41    ROS Blood pressure (!) 149/70, pulse (!) 106, temperature 98.5 F (36.9 C), temperature source Oral, resp. rate 16, height 5' 2" (1.575 m), weight 45.1 kg (99 lb 6.8 oz), SpO2 100 %. Neurologic Examination:  Awake, alert, disoriented.  Dysarthria.  Cannot answer questions well.  Able to follow commands.    No facial asymmetric.  LUE is bruised and swollen.  She resists movement of the LUE.    LLE also has bruises, but able to move easier.  RUE and RLE are 5/5.    Grimaces and withdraws to pain on the LUE and LLE.  Pinprick testing is unreliable due to dementia.    No babinski.  No hoffman's.      Assessment/Plan:  Due to the dementia, it is difficult to assess whether she truly has left sided weakness since she does not follow well.  In addition, the pain associated with the rhabdomyolysis restricts her left sided movement as she favors it and  therefore cannot tell accurately if truly weak.   Therefore, stroke is possible but need an MRI Brain to confirm.  More likely, however, in my opinion is that she may have had a seizure due to dementia and then lost consciousness leading to the rhabdomyolysis.  I will do an EEG to assess for that.  If MRI is negative for stroke, I would recommend initiation of AED even if EEG does not show active epileptiform discharges.    , , MD 03/11/2017, 10:21 PM  

## 2017-03-11 NOTE — ED Triage Notes (Signed)
[facility]- pt coming from home with stroke sx. LSN last night at 1700. Pt completely flaccid on the left side. Pt has dementia at baseline. Currently taking cipro, not sure what for. Pt covered in feces and vomit on arrival.

## 2017-03-12 ENCOUNTER — Inpatient Hospital Stay (HOSPITAL_COMMUNITY): Payer: Medicare Other

## 2017-03-12 ENCOUNTER — Telehealth: Payer: Self-pay | Admitting: Osteopathic Medicine

## 2017-03-12 DIAGNOSIS — R569 Unspecified convulsions: Secondary | ICD-10-CM

## 2017-03-12 DIAGNOSIS — Z515 Encounter for palliative care: Secondary | ICD-10-CM

## 2017-03-12 DIAGNOSIS — E860 Dehydration: Secondary | ICD-10-CM

## 2017-03-12 DIAGNOSIS — M6282 Rhabdomyolysis: Secondary | ICD-10-CM

## 2017-03-12 DIAGNOSIS — R531 Weakness: Secondary | ICD-10-CM

## 2017-03-12 DIAGNOSIS — R410 Disorientation, unspecified: Secondary | ICD-10-CM

## 2017-03-12 LAB — CBC
HCT: 38.4 % (ref 36.0–46.0)
HEMOGLOBIN: 12.4 g/dL (ref 12.0–15.0)
MCH: 29.2 pg (ref 26.0–34.0)
MCHC: 32.3 g/dL (ref 30.0–36.0)
MCV: 90.4 fL (ref 78.0–100.0)
PLATELETS: 218 10*3/uL (ref 150–400)
RBC: 4.25 MIL/uL (ref 3.87–5.11)
RDW: 14.1 % (ref 11.5–15.5)
WBC: 9 10*3/uL (ref 4.0–10.5)

## 2017-03-12 LAB — URINE CULTURE: CULTURE: NO GROWTH

## 2017-03-12 LAB — COMPREHENSIVE METABOLIC PANEL
ALBUMIN: 2.8 g/dL — AB (ref 3.5–5.0)
ALK PHOS: 42 U/L (ref 38–126)
ALT: 27 U/L (ref 14–54)
ANION GAP: 7 (ref 5–15)
AST: 49 U/L — ABNORMAL HIGH (ref 15–41)
BILIRUBIN TOTAL: 1.5 mg/dL — AB (ref 0.3–1.2)
BUN: 17 mg/dL (ref 6–20)
CALCIUM: 7.9 mg/dL — AB (ref 8.9–10.3)
CO2: 20 mmol/L — ABNORMAL LOW (ref 22–32)
Chloride: 111 mmol/L (ref 101–111)
Creatinine, Ser: 1.02 mg/dL — ABNORMAL HIGH (ref 0.44–1.00)
GFR calc Af Amer: 60 mL/min — ABNORMAL LOW (ref >60)
GFR calc non Af Amer: 52 mL/min — ABNORMAL LOW (ref >60)
GLUCOSE: 104 mg/dL — AB (ref 65–99)
Potassium: 3.3 mmol/L — ABNORMAL LOW (ref 3.5–5.1)
Sodium: 138 mmol/L (ref 135–145)
TOTAL PROTEIN: 5.3 g/dL — AB (ref 6.5–8.1)

## 2017-03-12 LAB — TSH: TSH: 4.686 u[IU]/mL — ABNORMAL HIGH (ref 0.350–4.500)

## 2017-03-12 LAB — VITAMIN B12: VITAMIN B 12: 454 pg/mL (ref 180–914)

## 2017-03-12 LAB — CK: CK TOTAL: 1922 U/L — AB (ref 38–234)

## 2017-03-12 MED ORDER — HEPARIN SODIUM (PORCINE) 5000 UNIT/ML IJ SOLN
5000.0000 [IU] | Freq: Three times a day (TID) | INTRAMUSCULAR | Status: DC
  Administered 2017-03-12 – 2017-03-18 (×19): 5000 [IU] via SUBCUTANEOUS
  Filled 2017-03-12 (×19): qty 1

## 2017-03-12 MED ORDER — SODIUM CHLORIDE 0.9% FLUSH
3.0000 mL | Freq: Two times a day (BID) | INTRAVENOUS | Status: DC
  Administered 2017-03-12 – 2017-03-18 (×11): 3 mL via INTRAVENOUS

## 2017-03-12 MED ORDER — ZINC OXIDE 40 % EX OINT
TOPICAL_OINTMENT | Freq: Two times a day (BID) | CUTANEOUS | Status: DC
  Administered 2017-03-12: 1 via TOPICAL
  Administered 2017-03-12 – 2017-03-13 (×2): via TOPICAL
  Administered 2017-03-13: 1 via TOPICAL
  Administered 2017-03-14: 10:00:00 via TOPICAL
  Administered 2017-03-14: 1 via TOPICAL
  Administered 2017-03-15: 10:00:00 via TOPICAL
  Administered 2017-03-15: 1 via TOPICAL
  Administered 2017-03-16 – 2017-03-17 (×4): via TOPICAL
  Filled 2017-03-12 (×2): qty 114

## 2017-03-12 MED ORDER — LORAZEPAM 2 MG/ML IJ SOLN
0.5000 mg | Freq: Once | INTRAMUSCULAR | Status: AC | PRN
  Administered 2017-03-12: 22:00:00 via INTRAVENOUS
  Filled 2017-03-12: qty 1

## 2017-03-12 MED ORDER — DEXTROSE-NACL 5-0.9 % IV SOLN
INTRAVENOUS | Status: DC
  Administered 2017-03-12 (×2): via INTRAVENOUS
  Filled 2017-03-12 (×4): qty 1000

## 2017-03-12 NOTE — Progress Notes (Addendum)
Pt is very confused and unable to participate in assessment. No family contact info noted in [place]. Spoke with Dr. Ardelia Mems. Will continue and try to reach family. Bedside RN to call PMT if family arrives and update demographics in [facility]. Thank you, Romona Curls, ANP  Addendum 1450: Followed up with pt and RN. No family at bedside and not in to visit yet. Observed pt working with PT. Unable to sit up on her own. Is moving all extremities x 4 to command  Addendum 1609: Met briefly with son Dellis Filbert who is at the bedside; " we are simple country people and I don't want you to keep trying to talk to me about death". This was my first meeting with son. Will speak to attending that family does not sound interested in palliative discussion at least not at this time, or by this provider.Demographic contact info updated in Epic Thank you, Romona Curls, ANP

## 2017-03-12 NOTE — Progress Notes (Signed)
NEUROLOGY FOLLOW UP  Subjective: Seen and examined at bedside No new complaints per nursing staff. Patient not very cooperative with the exam.   Exam: Vitals:   03/12/17 0725 03/12/17 0814  BP:    Pulse: 75   Resp: 20   Temp:  98.3 F (36.8 C)   Gen: In bed, NAD Resp: non-labored breathing, no acute distress Abd: soft, nt Neuro: MS: awake, alert, not oriented to person place or time CN: PERRL, EOMI, VVF to threat, face grossly symmetric, auditory acuity grossly intact. Motor: bruising on both UE. Cries in pain on attempt to touch or move UEs b/l. Antigravity bilateral LE. Sensory:uncooperative. Withdraws and grimaces to nox stim all over. DTR:2+ all over, toes downgoing b/l  Pertinent Labs: UA -ve, no significant derangement on CMP. CK 3142 WBC 13.5  Reviewed images. CTH shows chronic microvascular changes and atrophy. No acute changes  MEDS  Current Facility-Administered Medications:  .  [COMPLETED] sodium chloride 0.9 % bolus 1,000 mL, 1,000 mL, Intravenous, Once, Stopped at 03/11/17 1821 **FOLLOWED BY** 0.9 %  sodium chloride infusion, 1,000 mL, Intravenous, Continuous, Dorie Rank, MD, Last Rate: 125 mL/hr at 03/12/17 0540, 1,000 mL at 03/12/17 0540 .  heparin injection 5,000 Units, 5,000 Units, Subcutaneous, Q8H, Leeanne Rio, MD, 5,000 Units at 03/12/17 0540 .  sodium chloride flush (NS) 0.9 % injection 3 mL, 3 mL, Intravenous, Q12H, Sela Hilding, MD   Assessment 76/F  PMH of dementia, living by herself found slumped over on a chair. Brought in as a code stroke for concern for left sided weakness. Exam limited by patient cooperation and pain but seems to be non focal. She seems encephalopathic.  Impression Evaluate for stroke Evaluate for seizure Altered mental status - toxic metabolic encephalopathy in setting of rhabdomyolysis Dementia  Recommendations: -Keppra 500 BID since high suspicion for seizure as well as h/o dementia -MRI to eval  for stroke as well as structural lesion that can be consistent with a seizure focus -EEG -management of rhabdo per primary team as you are. -Outpatient neurology follow up for dementia Call with questions  Amie Portland, MD [facility] [phone]  If 7pm to 7am, please call on call as listed on AMION.

## 2017-03-12 NOTE — Progress Notes (Signed)
Interim Note  While I was seeing patient for admission earlier today, I spoke with patient's son Ria Redcay) and daughter in law at bedside regarding patient's code status. Two teenage grandchildren were also present in the room. This discussion was begun yesterday evening by the admitting resident team. At that time, Nicki Reaper elected for patient to remain full code.  When I brought up the idea of her code status earlier today and asked what we should do were her heart to stop, Nicki Reaper replied that he would want [place] to "do nothing". I clarified by asking if that means we should not do any CPR or intubation. He confirmed that this is what he meant. I advised I would put a DNR order into the chart. Expressed to [place] and daughter in law that I believe this is the right decision for the patient, that she would do poorly after being coded and would be unlikely to regain any meaningful quality of life, especially in light of her chronic health problems (primarily dementia).  After I said this, son Nicki Reaper became emotional and stepped out of the room quickly. His wife then said, "he's been drinking so he's more emotional" in reference to [place]. Prior to her saying this, I had no indication that Nicki Reaper had been drinking or was in any way impaired. He was helpful in providing his mother's history to me, was speaking clearly, seemed to be thinking clearly, and showed no signs of physical intoxication.  Shortly later, while Nicki Reaper was still out of the room, I asked his wife to step out in the hallway, and explained that I needed to be sure that Nicki Reaper is of sound mind and has capacity to make decisions for his mother, and that alcohol could potentially limit his ability to make decisions. I asked her if she felt that Nicki Reaper was thinking clearly and whether this is a decision he has come to out of a sound mind. His wife stated that he is a "functional alcoholic" and that "it runs in the family" (pt also has a history of heavy  ETOH use, per family). Scott's wife agreed that this is truly the decision he wanted to make, that they had discussed it last night after patient was admitted, and that he came to that conclusion last night that patient should be made DNR. She stated she believes that Nicki Reaper is thinking clearly and this is a true decision.  After this conversation, I contacted the on-call ethics committee consultant, Emeterio Reeve, who was very helpful in helping me determine whether to honor the DNR request that [place] made. She advised speaking with Nicki Reaper again and asking whether the DNR decision is one that his mother would want for herself.  As the family was not at the bedside any longer, I called Scott's cell phone number and reached him. I explained why I was calling, that I want to do the right thing for his mother and their family, and that I needed to make sure he is thinking clearly in making the DNR decision for his mother. He understood and was willing to speak to me again about the DNR order.  I asked whether he believes that his mom would want to be made DNR, and he stated "I can't really answer that". He reported that he and his mom had never discussed what she would want in this situation. He did say "I'm sure she's tired of all of this" (presumably in reference to her dementia). I asked him to explain  why he believes DNR is the right decision for his mother, and he stated that "if it's not going to help her to do those things, then why do them?" I asked how many drinks he had had today, and he said "maybe 6-8 beers". He confirmed that this is a typical amount for him to drink in a day.  I asked if he felt like he was thinking clearly and whether this is truly the decision he wants to make, and he said he is thinking clearly and this is the choice he feels is right for his mother. I reaffirmed to him that I agree that DNR/DNI is the right decision to make.  I believe that Nicki Reaper does have capacity to make  this decision, and that he is acting in his mother's best interest. He showed no signs of clinical intoxication and is able to verbalize well why DNR is in his mother's best interest. I have proceeded with entering a DNR order in the chart.  Updated ethics consultant Emeterio Reeve by phone, who was in agreement with proceeding with DNR order.  Chrisandra Netters, MD [place]

## 2017-03-12 NOTE — Procedures (Signed)
ELECTROENCEPHALOGRAM REPORT  Date of Study: 03/12/17  Patient's Name: Susan Ryan MRN: [id number] Date of Birth: [birth year]    Referring Provider: Rogue Jury, MD  Clinical History: 76 YO F with dementia and recent syncopal episode.  Noted to have Lt side weakness.  CT Brain shows global atrophy and small vessel ischemic disease.  Tech notes she was confused during the tracing and had difficulty following commands.  Medications:  Current Facility-Administered Medications:  .  dextrose 5 % and 0.9% NaCl 1,000 mL infusion, , Intravenous, Continuous, Mayo, Pete Pelt, MD, Last Rate: 125 mL/hr at 03/12/17 1034 .  heparin injection 5,000 Units, 5,000 Units, Subcutaneous, Q8H, Leeanne Rio, MD, 5,000 Units at 03/12/17 1339 .  liver oil-zinc oxide (DESITIN) 40 % ointment, , Topical, BID, Ardelia Mems, Delorse Limber, MD .  sodium chloride flush (NS) 0.9 % injection 3 mL, 3 mL, Intravenous, Q12H, Sela Hilding, MD, 3 mL at 03/12/17 1216            Technical Summary: A multichannel digital EEG recording measured by the international 10-20 system with electrodes applied with paste and impedances below 5000 ohms performed as portable with EKG monitoring in a predominantly awake and drowsy states.  Hyperventilation and photic stimulation were not performed.  The digital EEG was referentially recorded, reformatted, and digitally filtered in a variety of bipolar and referential montages for optimal display.   Description: The patient is predominantly awake and drowsy during the recording.  In the awake state there is a 6 Hz theta rhythm seen from the posterior head regions in a symmetric fashion.   Drowsiness is noted by vertex slowing.  Stage II sleep not seen.  Neither HV or Photic stimulation were performed.  EKG was monitored and noted to be sinus rhythm with an average HR of 96 bpm.  No epileptiform changes were noted.      Impression: This is an abnormal EEG due to background  slowing seen throughout the tracing.  This is a very non-specific finding that can be seen with toxic, metabolic, diffuse or multifocal structural processes.  No definite epileptiform changes were noted.  A single unremarkable EEG does not exclude the diagnosis of epilepsy.  Clinical correlation advised.   Carvel Getting, M.D.

## 2017-03-12 NOTE — Progress Notes (Signed)
Bedside EEG completed, results pending. 

## 2017-03-12 NOTE — Consult Note (Signed)
[facility] Nurse wound consult note Reason for Consult:Moisure associated skin damage (incontinence associated) Wound type: MASD to perineum.  Incontinence and altered mental status Pressure Injury POA:NA Measurement: Scattered denuded skin with erythema to perineal region.  Wound MCR:FVOH and moist Drainage (amount, consistency, odor) scant serous  No odor.  Periwound:intact Dressing procedure/placement/frequency:Cleanse perineum daily with soap and water and pat dry daily and PRN incontinence. Boudreaux butt paste twice daily.  Will not follow at this time.  Please re-consult if needed.  Domenic Moras RN BSN [place] Pager [phone]

## 2017-03-12 NOTE — Progress Notes (Signed)
Contact information for son Kattaleya Alia [phone]

## 2017-03-12 NOTE — Care Management Note (Signed)
Case Management Note  Patient Details  Name: Susan Ryan MRN: [id number] Date of Birth: December 25, 1940  Subjective/Objective:                 CM consult order for "other" without any specification for need. Patient unable to participate in assessment, no contacts listed. Please contact CM for any additional needs.    Action/Plan:   Expected Discharge Date:  03/16/17               Expected Discharge Plan:     In-House Referral:     Discharge planning Services     Post Acute Care Choice:    Choice offered to:     DME Arranged:    DME Agency:     HH Arranged:    HH Agency:     Status of Service:     If discussed at [employer], dates discussed:    Additional Comments:  Carles Collet, RN 03/12/2017, 12:56 PM

## 2017-03-12 NOTE — Evaluation (Signed)
Clinical/Bedside Swallow Evaluation Patient Details  Name: Susan Ryan MRN: [id number] Date of Birth: 1941-05-09  Today's Date: 03/12/2017 Time: SLP Start Time (ACUTE ONLY): 0930 SLP Stop Time (ACUTE ONLY): 0950 SLP Time Calculation (min) (ACUTE ONLY): 20 min  Past Medical History:  Past Medical History:  Diagnosis Date  . Dementia    Past Surgical History: History reviewed. No pertinent surgical history. HPI:  76/F PMH of dementia, living by herself found slumped over on a chair. 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 CT Brain shows global atrophy and small vessel ischemic disease. CXR is normal. MRI pending.   Assessment / Plan / Recommendation Clinical Impression  Patient presents with severe risk for aspiration in the setting of altered mentation, dementia, and consistent clinical signs of aspiration across all consistencies trialed at bedside, including thin, nectar, and honey-thick liquids, pureed solids. Patient is alert and oriented to self only, responding verbally to speech, following 80% of basic commands. Oral motor examination remarkable for generalized weakness. During assessment, pt appears to have sensory deficits impacting bolus retrieval and timing of swallow initiation, and is observed with oral holding across consistencies. Suspect premature spillage compromising airway protection. Generally, overt signs of aspiration are reduced with self feeding, as well as thicker consistencies which appear to improve sensation and oral control of the bolus. No family present to determine goals of care at this time. Conservative recommendation at this time is for pt to remain NPO pending instrumental examination (MBS) to rule out aspiration, aid in determining safest, least restrictive diet and identifying compensatory measures/feeding strategies to improve swallow function. Should family pursue comfort-focused  care, recommendation is for pureed solids with thin liquids (water is best) given apparent reduced airway protection at bedside with all liquids, including thickened. Strict oral care to reduce risks for aspiration. MBS may also be beneficial to aid in determining goals of care. MD informed, no family present and no contact information available. SLP will follow up next date for goals of care, MBS if warranted. SLP Visit Diagnosis: Dysphagia, unspecified (R13.10)    Aspiration Risk  Severe aspiration risk;Risk for inadequate nutrition/hydration    Diet Recommendation NPO        Other  Recommendations Oral Care Recommendations: Oral care QID   Follow up Recommendations Other (comment) (TBD)      Frequency and Duration min 2x/week  2 weeks       Prognosis Prognosis for Safe Diet Advancement: Fair Barriers to Reach Goals: Cognitive deficits;Severity of deficits      Swallow Study   General Date of Onset: 03/11/17 HPI: 76/F PMH of dementia, living by herself found slumped over on a chair. 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 CT Brain shows global atrophy and small vessel ischemic disease. CXR is normal. MRI pending. Type of Study: Bedside Swallow Evaluation Previous Swallow Assessment: none in chart Diet Prior to this Study: NPO Temperature Spikes Noted: No Respiratory Status: Room air History of Recent Intubation: No Behavior/Cognition: Alert;Confused;Requires cueing Oral Cavity Assessment: Within Functional Limits Oral Care Completed by SLP: No Oral Cavity - Dentition: Edentulous Vision: Functional for self-feeding Self-Feeding Abilities: Needs assist Patient Positioning: Upright in bed Baseline Vocal Quality: Low vocal intensity;Wet Volitional Cough: Cognitively unable to elicit Volitional Swallow: Unable to elicit    Oral/Motor/Sensory Function Overall Oral Motor/Sensory Function: Generalized  oral weakness   Ice Chips Ice  chips: Impaired Presentation: Spoon Oral Phase Impairments: Poor awareness of bolus;Reduced labial seal Oral Phase Functional Implications: Oral holding Pharyngeal Phase Impairments: Suspected delayed Swallow;Cough - Delayed   Thin Liquid Thin Liquid: Impaired Presentation: Cup;Spoon;Self Fed Oral Phase Impairments: Impaired mastication;Reduced labial seal Oral Phase Functional Implications: Oral holding;Left anterior spillage Pharyngeal  Phase Impairments: Suspected delayed Swallow;Wet Vocal Quality;Cough - Immediate    Nectar Thick Nectar Thick Liquid: Impaired Presentation: Cup;Spoon;Self Fed Oral Phase Impairments: Poor awareness of bolus;Reduced labial seal Oral phase functional implications: Left anterior spillage;Oral holding;Prolonged oral transit Pharyngeal Phase Impairments: Suspected delayed Swallow;Wet Vocal Quality;Cough - Delayed   Honey Thick Honey Thick Liquid: Impaired Presentation: Spoon;Cup;Self fed Oral Phase Impairments: Poor awareness of bolus Oral Phase Functional Implications: Oral holding Pharyngeal Phase Impairments: Cough - Immediate;Wet Vocal Quality   Puree Puree: Impaired Presentation: Spoon Oral Phase Impairments: Poor awareness of bolus Oral Phase Functional Implications: Oral holding;Oral residue Pharyngeal Phase Impairments: Cough - Delayed   Solid   GO   Solid: Not tested       Deneise Lever, MS, CCC-SLP Speech-Language Pathologist [phone]  Aliene Altes 03/12/2017,10:11 AM

## 2017-03-12 NOTE — Progress Notes (Signed)
Family Medicine Teaching Service Daily Progress Note Intern Pager: [phone]  Patient name: Susan Ryan Medical record number: [id number] Date of birth: Mar 28, 1941 Age: 76 y.o. Gender: female  Primary Care Provider: Patient, No Pcp Per Consultants: neurology Code Status: full  Pt Overview and Major Events to Date:  7/13: Admitted to FMTS for AMS and mild rhabdo  Assessment and Plan: Susan Ryan is a 76 y.o. female presenting with AMS, having been found confused in . PMH is significant for afib.   AMS: possibly due to stroke vs seizure vs dehydration. Patient not moving left arm on exam and unable to tell me if sensation is intact, so need to rule out stroke. Unsure if she is on any medications at home that could be causing this. Drug/alcohol intoxication ruled out with negative UDS and serum alcohol. CT head negative, ruling out intracranial bleed. Infection less likely due to UA without leukocytes/nitrite, negative CXR, no fevers. Elevated WBC count likely elevated in the setting of dehydration. Cardiac etiology less likely with negative troponins  -s/p 1L fluid bolus in the ED, will continue IVFs at 125cc/hr today -Neurology following, appreciate recs: MRI and EEG pending to rule out stroke/seizure -Blood and urine cultures pending -Will monitor for fevers/signs of infection. Hold on antibiotics for now. -Delirium precautions -Neuro checks q2hrs  Left arm pain: patient refuses to use left arm and is holding it close to her body. Patient jumps when I attempt to examine her arm. Could be sore 2/2 to rhabdo. Could also not be using her arm in the setting of stroke. Good radial pulse and no edema, so low suspicion for compartment syndrome. - Because she lives alone and we do not know if she has fallen, will go ahead and order x-ray to rule out fracture.  Rhabdomyolysis, mild: CK on admission 3142. AST also mildly elevated to 70. UA with hemoglobin but no RBCs. - s/p 1L NS bolus in the ED,  continue IVFs at 125cc/hr today. - Repeat CK pending  Dementia: unable to perform ADLs at home -Delirium precautions -NPO for now, speech eval pending -PT/OT -Palliative consult for goals of care discussion and discharge planning -Will also call family today to discuss goals of care  HTN: BPs as high as 162/62 overnight. BP 131/80. Goal <160/100 in this demented, elderly patient. - Will monitor and add prn med if significantly elevated.  ?Afib: Concern for A-fib on admission EKG; however, on further evaluation, it appears that she has a regular rate and p waves are noted in most leads. Repeat EKG this morning with sinus arrhythmia. Has been in NSR on telemetry overnight. -Monitor on telemetry  Skin breakdown: patient with erythema over genital area, likely due to fecal incontinence as well as sedentary lifestyle. -Wound consult  FEN/GI: NPO per speech recommendations Prophylaxis: heparin subcutaneous as head CT negative for acute bleed  Disposition: Keep in stepdown today due to frequent neuro checks. Consider transfer to floor tomorrow. Ultimately, anticipate discharge to SNF.  Subjective:  Patient states that nothing is bothering her this morning, but does not answer any questions.  Objective: Temp:  [97.9 F (36.6 C)-100.1 F (37.8 C)] 98.3 F (36.8 C) (07/14 0814) Pulse Rate:  [71-106] 75 (07/14 0725) Resp:  [12-28] 20 (07/14 0725) BP: (115-173)/(49-91) 131/80 (07/14 0700) SpO2:  [97 %-100 %] 99 % (07/14 0725) Weight:  [99 lb 6.8 oz (45.1 kg)] 99 lb 6.8 oz (45.1 kg) (07/13 2100) Physical Exam: General: cachectic and disheveled female, awake and talking but not making  sense HEENT: [place]/AT, EOMI, mildly dry mucous membranes Neck: supple Cardiovascular: RRR, no murmur Respiratory: CTAB, easy WOB  Gastrointestinal: SNTND, +BS MSK: does not move left upper extremity but will move all other extremities, generalized tenderness to palpation of the left arm, no edema or  instability noted in left arm. Derm: no rashes or lesions on exposed skin Neuro: awake, alert, does not answer questions of orientation, talking but not making sense, no slurred speech, no dysarthria, holding left upper extremity against body Psych: unable to assess  Laboratory:  Recent Labs Lab 03/11/17 1722  WBC 13.5*  HGB 14.6  HCT 44.8  PLT 299    Recent Labs Lab 03/11/17 1722  NA 139  K 4.6  CL 102  CO2 23  BUN 26*  CREATININE 1.31*  CALCIUM 9.0  PROT 7.4  BILITOT 2.0*  ALKPHOS 68  ALT 33  AST 70*  GLUCOSE 116*   UDS neg Ethanol neg INR 0.98 CK 3142 UA- amber color, +Hgb, +ketones, +squams  Imaging/Diagnostic Tests: CXR- no acute process CT head- atrophy, no acute intracranial abnormality  Susan Ryan, Pete Pelt, MD 03/12/2017, 8:20 AM PGY-3, [facility] Intern pager: [phone], text pages welcome

## 2017-03-12 NOTE — Plan of Care (Signed)
Problem: Education: Goal: Knowledge of [facility] Education information/materials will improve Outcome: Progressing Discussed with patient about plan of care and keeping her regulated bathroom schedules with teach back explained and needs to be rienforced.

## 2017-03-12 NOTE — Discharge Summary (Signed)
[facility] Discharge Summary  Susan Ryan name: Susan Ryan Medical record number: [id number] Date of birth: Mar 23, 1941 Age: 76 y.o. Gender: female Date of Admission: 03/11/2017  Date of Discharge: 03/18/17 Admitting Physician: Leeanne Rio, MD  Primary Care Provider: Patient, No Pcp Per Consultants: Neurology  Indication for Hospitalization: AMS  Discharge Diagnoses/Problem List:  AMS Rhabdomyolysis, mild Dementia HTN Skin breakdown  Disposition: SNF  Discharge Condition: stable, improved, still very altered  Discharge Exam: please see progress note from day of discharge  Brief Hospital Course:  Susan Ryan is a 76 year old female who was brought into the ED for acute AMS. She was found slumped over in a chair by her son, sitting in her own feces. She was last noted to be normal about 24 hours prior to presentation in the ED. She lives alone and has difficulties with memory for the last 2-3 years. At baseline, she is able to ambulate, but she is unable to feed or dress herself. She talks to people who are not present and has visual hallucinations. In the ED, labs were unremarkable, except for a mildly elevated WBC count to 13.5, a Cr of 1.31, and a CK of 3,142. Trop 0.01. Alcohol level was <5. UA showed ketones and small Hgb, but only 0-5 RBCs. CXR showed no acute abnormalities. EKG showed normal sinus rhythm. CT head showed no acute abnormalities. She was admitted for further management. Hospital course is described by problem list below.  Altered mental status: Likely due to stroke in addition to baseline dementia. Not on any known medications that could have caused this. 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 Neuro also recommended EEG, which showed non-specific background slowing. Blood and urine cultures were ordered to rule out infectious etiology and they remained negative.   Susan Ryan had fluctuating mentation but at baseline could not communicate clearly, had trouble focusing, and did not have motivation to work with therapy.  She also became agitated frequently, especially with her IVs, and would pull them out, so she was on mitt restraints for a few days.  She refused PO intake for a few days, so she was kept on D5NS for nutrition.  Her mitts were removed once she began to take some nutrition and medications PO, her IVs were discontinued, and she tolerated this well.  Left arm pain: Susan Ryan initially refused to move her left arm. She had a good radial pulse and no edema, so we had a low suspicion for compartment syndrome. X-ray performed of left humerus and left forearm and was negative for fracture. At discharge, Susan Ryan was nontender to palpation of that arm and denied pain.  Rhabdomyolysis, mild: Susan Ryan with CK of 3,142 on admission. She was given a 1L NS bolus and was placed on IVFs. Her CK trended down to 1,922 on hospital day 2. We then stopped checking CK.  Dementia: Per family, Susan Ryan is unable to perform ADLs at home. She was evaluated by SLP and was cleared for dysphagia I diet with honey thick liquids.  They were unable to perform modified barium swallow due to Susan Ryan's altered mentation.  PT/OT recommended SNF placement, did warn that she may not respond well to therapy and may need SNF for long-term care rather than for rehab. Palliative care was consulted for a goals of care discussion and recommended, but family declined discussion.  Skin Breakdown: Susan Ryan was noted to have erythema over the genital area, likely worsened  by fecal incontinence. Wound RN was consulted and recommended daily cleansing and drying, and application of Boudreaux butt paste.  Issues for Follow Up:  1. Slightly elevated TSH during hospital stay - please recheck 4 weeks after discharge 2. Hypokalemic during hospital stay - please check daily and replenish with Kdur if  needed 3. Goals of care discussion with family, as Susan Ryan will most likely need 24 hour assistance  Significant Procedures: none  Significant Labs and Imaging:   Recent Labs Lab 03/15/17 0757 03/16/17 0527 03/17/17 0438  WBC 8.0 7.3 9.7  HGB 13.6 12.8 14.3  HCT 40.3 39.7 43.9  PLT 313 332 396    Recent Labs Lab 03/11/17 1722 03/12/17 1037  03/14/17 0159 03/15/17 0757 03/16/17 0527 03/17/17 0438 03/18/17 0753  NA 139 138  < > 137 138 138 140 140  K 4.6 3.3*  < > 4.0 3.2* 3.3* 2.9* 3.2*  CL 102 111  < > 106 108 109 106 106  CO2 23 20*  < > 24 22 21* 24 22  GLUCOSE 116* 104*  < > 109* 125* 114* 102* 96  BUN 26* 17  < > <5* <5* <5* <5* 5*  CREATININE 1.31* 1.02*  < > 0.75 0.70 0.85 0.79 0.81  CALCIUM 9.0 7.9*  < > 8.3* 8.4* 8.5* 8.9 9.0  ALKPHOS 68 42  --   --   --   --   --   --   AST 70* 49*  --   --   --   --   --   --   ALT 33 27  --   --   --   --   --   --   ALBUMIN 3.9 2.8*  --   --   --   --   --   --   < > = values in this interval not displayed.  Dg Forearm Left  Result Date: 03/12/2017 CLINICAL DATA:  Left arm pain.  No known injury.  Dementia. EXAM: LEFT FOREARM - 2 VIEW COMPARISON:  None. FINDINGS: Proximal soft tissue swelling or prominent musculature. No fracture or dislocation. IMPRESSION: No fracture. Electronically Signed   By: Claudie Revering M.D.   On: 03/12/2017 12:06   Ct Head Wo Contrast  Result Date: 03/11/2017 CLINICAL DATA:  Left-sided weakness concerning for stroke. EXAM: CT HEAD WITHOUT CONTRAST TECHNIQUE: Contiguous axial images were obtained from the base of the skull through the vertex without intravenous contrast. COMPARISON:  None. FINDINGS: Brain: Mild diffuse cortical atrophy is noted. Mild chronic ischemic white matter disease is noted. No mass effect or midline shift is noted. Ventricular size is within normal limits. There is no evidence of mass lesion, hemorrhage or acute infarction. Vascular: No hyperdense vessel or unexpected  calcification. Skull: Normal. Negative for fracture or focal lesion. Sinuses/Orbits: No acute finding. Other: None. IMPRESSION: Mild diffuse cortical atrophy. Mild chronic ischemic white matter disease. No acute intracranial abnormality seen. Electronically Signed   By: Marijo Conception, M.D.   On: 03/11/2017 16:33   Mr Brain Wo Contrast  Result Date: 03/12/2017 CLINICAL DATA:  Altered mental status. History of hypertension and dementia. Assess for stroke. EXAM: MRI HEAD WITHOUT CONTRAST TECHNIQUE: Multiplanar, multiecho pulse sequences of the brain and surrounding structures were obtained without intravenous contrast. COMPARISON:  CT HEAD March 11, 2017 at 1626 hours FINDINGS: Sequences are moderately or severely motion degraded. BRAIN: 19 x 7 mm reduced diffusion RIGHT basal ganglia to internal capsule with low  ADC values. No susceptibility artifact to suggest lobar hematoma though motion degrades sensitivity for micro hemorrhages. Moderate ventriculomegaly on the basis of global parenchymal brain volume loss. Patchy supratentorial white matter FLAIR T2 hyperintensities. No midline shift, mass effect or definite masses though limited by Susan Ryan motion. No abnormal extra-axial fluid collections. VASCULAR: Major intracranial vascular flow voids present at skull base. SKULL AND UPPER CERVICAL SPINE: Limited assessment due to motion. No cerebellar tonsillar ectopia. SINUSES/ORBITS: Limited examination. No paranasal sinus air-fluid levels. OTHER: None. IMPRESSION: 1. Moderate to severely motion degraded examination. 2. 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 3. Moderate atrophy in and at at least mild to moderate chronic small vessel ischemic disease. Electronically Signed   By: Elon Alas M.D.   On: 03/12/2017 23:09   Dg Chest Portable 1 View  Result Date: 03/11/2017 CLINICAL DATA:  Weakness EXAM: PORTABLE CHEST 1 VIEW COMPARISON:  None. FINDINGS: The Susan Ryan is rotated  significantly to the left which limits the study. Suspect mild cardiomegaly. No definite confluent opacity, effusion or edema. No acute bony abnormality. IMPRESSION: Study limited by Susan Ryan rotation. Cardiomegaly. No definite acute process. Electronically Signed   By: Rolm Baptise M.D.   On: 03/11/2017 17:41   Dg Humerus Left  Result Date: 03/12/2017 CLINICAL DATA:  Left arm pain.  No known injury. EXAM: LEFT HUMERUS - 2+ VIEW COMPARISON:  None. FINDINGS: There is no evidence of fracture or other focal bone lesions. Soft tissues are unremarkable. IMPRESSION: Normal examination. Electronically Signed   By: Claudie Revering M.D.   On: 03/12/2017 12:05    Results/Tests Pending at Time of Discharge: none  Discharge Medications:  Allergies as of 03/18/2017   No Known Allergies     Medication List    STOP taking these medications   ciprofloxacin 500 MG tablet Commonly known as:  CIPRO   QUEtiapine 50 MG tablet Commonly known as:  SEROQUEL     TAKE these medications   amLODipine 5 MG tablet Commonly known as:  NORVASC Take 1 tablet (5 mg total) by mouth daily.   aspirin 325 MG EC tablet Take 1 tablet (325 mg total) by mouth daily.   liver oil-zinc oxide 40 % ointment Commonly known as:  DESITIN Apply topically 2 (two) times daily.   nitrofurantoin (macrocrystal-monohydrate) 100 MG capsule Commonly known as:  MACROBID Take 100 mg by mouth 2 (two) times daily.       Discharge Instructions: Please refer to Susan Ryan Instructions section of EMR for full details.  Susan Ryan was counseled important signs and symptoms that should prompt return to medical care, changes in medications, dietary instructions, activity restrictions, and follow up appointments.   Follow-Up Appointments:  Contact information for follow-up providers    Dennie Bible, NP. Schedule an appointment as soon as possible for a visit in 6 week(s).   Specialty:  Family Medicine Why:  Stroke follow up Contact  information: [address] [place] [address] [phone]            Contact information for after-discharge care    Destination    [facility] .   Specialty:  [place] Contact information: [address] [place]                  Kathrene Alu, MD 03/18/2017, 12:09 PM PGY-1, [place]

## 2017-03-12 NOTE — Evaluation (Signed)
Physical Therapy Evaluation Patient Details Name: Susan Ryan MRN: [id number] DOB: 06-21-1941 Today's Date: 03/12/2017   History of Present Illness  76 yo F with PMhx of dementia and hypertension admitted 03/11/17 with altered mental status and rhabdomyolysis, after being found slumped sideways in a chair by her family.  Clinical Impression  Patient presents with problems listed below.  Patient having difficulty working with PT due to decreased cognition, and agitation.  Will attempt PT for 2 week trial for improvement in function and ability to participate. Recommend SNF at d/c.    Follow Up Recommendations SNF;Supervision/Assistance - 24 hour    Equipment Recommendations  Other (comment) (TBD)    Recommendations for Other Services       Precautions / Restrictions Precautions Precautions: Fall Precaution Comments: patient confused and can be agitated. Restrictions Weight Bearing Restrictions: No      Mobility  Bed Mobility Overal bed mobility: Needs Assistance Bed Mobility: Supine to Sit;Sit to Supine     Supine to sit: Max assist;+2 for physical assistance Sit to supine: Max assist;+2 for physical assistance   General bed mobility comments: Verbal cues for patient to move to sitting.  No movement toward EOB.  Assisted patient to move LE's off EOB.  Attempted to move trunk to upright position.  Requiring +2 max assist due to patient pushing back against therapist, and yelling.  Attempted to calm patient in partial sitting position.  Patient continued to yell and pushed back to supine (sideways on bed).  Repositioned patient in bed.  Transfers                 General transfer comment: NT  Ambulation/Gait                Stairs            Wheelchair Mobility    Modified Rankin (Stroke Patients Only)       Balance                                             Pertinent Vitals/Pain Pain Assessment: Faces Faces Pain Scale:  Hurts little more Pain Location: Back with movement Pain Descriptors / Indicators: Crying;Grimacing Pain Intervention(s): Limited activity within patient's tolerance;Repositioned    Home Living Family/patient expects to be discharged to:: Unsure Living Arrangements: Alone (Son and daughter-in-law live on property) Available Help at Discharge: Family;Available PRN/intermittently Type of Home: Mobile home Home Access: Stairs to enter Entrance Stairs-Rails: Right;Left Entrance Stairs-Number of Steps: 4 Home Layout: One level Home Equipment: None      Prior Function Level of Independence: Needs assistance   Gait / Transfers Assistance Needed: Ambulates independently.  ADL's / Homemaking Assistance Needed: Family assist with meal prep.  Family assists with bathing - only periodically due to patient not fully cooperating.  Patient incontinent and family cleans house.        Hand Dominance        Extremity/Trunk Assessment   Upper Extremity Assessment Upper Extremity Assessment: Difficult to assess due to impaired cognition (Able to move against gravity with Lt weaker than Rt)    Lower Extremity Assessment Lower Extremity Assessment: Difficult to assess due to impaired cognition (Able to move against gravity)       Communication   Communication: Expressive difficulties (Difficult to understand)  Cognition Arousal/Alertness: Awake/alert Behavior During Therapy: Agitated;Restless Overall Cognitive Status: Impaired/Different from baseline  Area of Impairment: Orientation;Attention;Memory;Safety/judgement;Problem solving                 Orientation Level: Disoriented to;Place;Time;Situation Current Attention Level: Focused Memory: Decreased short-term memory   Safety/Judgement: Decreased awareness of safety   Problem Solving: Slow processing;Difficulty sequencing General Comments: Patient with incoherent speech - making no sense.  Agitated, kicking toward PT.       General Comments      Exercises     Assessment/Plan    PT Assessment Patient needs continued PT services  PT Problem List Decreased strength;Decreased activity tolerance;Decreased balance;Decreased mobility;Decreased cognition;Decreased knowledge of use of DME;Decreased safety awareness;Pain       PT Treatment Interventions DME instruction;Gait training;Functional mobility training;Therapeutic activities;Balance training;Patient/family education    PT Goals (Current goals can be found in the Care Plan section)  Acute Rehab PT Goals Patient Stated Goal: Unable to state PT Goal Formulation: Patient unable to participate in goal setting Time For Goal Achievement: 03/26/17 Potential to Achieve Goals: Poor    Frequency Min 2X/week   Barriers to discharge Decreased caregiver support Patient lives alone.    Co-evaluation               AM-PAC PT "6 Clicks" Daily Activity  Outcome Measure Difficulty turning over in bed (including adjusting bedclothes, sheets and blankets)?: Total Difficulty moving from lying on back to sitting on the side of the bed? : Total Difficulty sitting down on and standing up from a chair with arms (e.g., wheelchair, bedside commode, etc,.)?: Total Help needed moving to and from a bed to chair (including a wheelchair)?: A Lot Help needed walking in hospital room?: A Lot Help needed climbing 3-5 steps with a railing? : Total 6 Click Score: 8    End of Session   Activity Tolerance: Treatment limited secondary to agitation;Patient limited by pain Patient left: in bed;with call bell/phone within reach;with bed alarm set;with family/visitor present Nurse Communication: Mobility status (Agitation) PT Visit Diagnosis: Other abnormalities of gait and mobility (R26.89);Muscle weakness (generalized) (M62.81);Other symptoms and signs involving the nervous system (R29.898);Pain Pain - part of body:  (back)    Time: 8850-2774 PT Time Calculation (min)  (ACUTE ONLY): 14 min   Charges:   PT Evaluation $PT Eval Moderate Complexity: 1 Procedure     PT G Codes:        Carita Pian. Sanjuana Kava, [facility] Acute Rehab Services Pager [place] 03/12/2017, 9:27 PM

## 2017-03-13 ENCOUNTER — Inpatient Hospital Stay (HOSPITAL_COMMUNITY): Payer: Medicare Other

## 2017-03-13 ENCOUNTER — Encounter (HOSPITAL_COMMUNITY): Payer: Self-pay

## 2017-03-13 DIAGNOSIS — I6330 Cerebral infarction due to thrombosis of unspecified cerebral artery: Secondary | ICD-10-CM | POA: Insufficient documentation

## 2017-03-13 LAB — AMMONIA: Ammonia: 30 umol/L (ref 9–35)

## 2017-03-13 LAB — CBC
HCT: 40 % (ref 36.0–46.0)
HEMOGLOBIN: 13.1 g/dL (ref 12.0–15.0)
MCH: 29.2 pg (ref 26.0–34.0)
MCHC: 32.8 g/dL (ref 30.0–36.0)
MCV: 89.1 fL (ref 78.0–100.0)
PLATELETS: 217 10*3/uL (ref 150–400)
RBC: 4.49 MIL/uL (ref 3.87–5.11)
RDW: 13.5 % (ref 11.5–15.5)
WBC: 10.5 10*3/uL (ref 4.0–10.5)

## 2017-03-13 LAB — LIPID PANEL
CHOL/HDL RATIO: 2.5 ratio
Cholesterol: 150 mg/dL (ref 0–200)
HDL: 61 mg/dL (ref >40)
LDL Cholesterol: 64 mg/dL (ref 0–99)
Triglycerides: 125 mg/dL (ref <150)
VLDL: 25 mg/dL (ref 0–40)

## 2017-03-13 LAB — BASIC METABOLIC PANEL
ANION GAP: 8 (ref 5–15)
Anion gap: 6 (ref 5–15)
BUN: 8 mg/dL (ref 6–20)
BUN: <5 mg/dL — ABNORMAL LOW (ref 6–20)
CHLORIDE: 107 mmol/L (ref 101–111)
CO2: 22 mmol/L (ref 22–32)
CO2: 23 mmol/L (ref 22–32)
CREATININE: 0.77 mg/dL (ref 0.44–1.00)
Calcium: 7.8 mg/dL — ABNORMAL LOW (ref 8.9–10.3)
Calcium: 7.9 mg/dL — ABNORMAL LOW (ref 8.9–10.3)
Chloride: 106 mmol/L (ref 101–111)
Creatinine, Ser: 0.68 mg/dL (ref 0.44–1.00)
GFR calc Af Amer: >60 mL/min (ref >60)
GFR calc non Af Amer: >60 mL/min (ref >60)
GFR calc non Af Amer: >60 mL/min (ref >60)
Glucose, Bld: 112 mg/dL — ABNORMAL HIGH (ref 65–99)
Glucose, Bld: 85 mg/dL (ref 65–99)
POTASSIUM: 2.9 mmol/L — AB (ref 3.5–5.1)
Potassium: 3.1 mmol/L — ABNORMAL LOW (ref 3.5–5.1)
SODIUM: 137 mmol/L (ref 135–145)
Sodium: 135 mmol/L (ref 135–145)

## 2017-03-13 LAB — TROPONIN I
TROPONIN I: 0.08 ng/mL — AB (ref <0.03)
Troponin I: <0.03 ng/mL (ref <0.03)
Troponin I: <0.03 ng/mL (ref <0.03)

## 2017-03-13 LAB — URINE CULTURE: Culture: NO GROWTH

## 2017-03-13 MED ORDER — KCL IN DEXTROSE-NACL 40-5-0.9 MEQ/L-%-% IV SOLN
INTRAVENOUS | Status: AC
  Administered 2017-03-13: 21:00:00 via INTRAVENOUS
  Filled 2017-03-13: qty 1000

## 2017-03-13 MED ORDER — IOPAMIDOL (ISOVUE-370) INJECTION 76%
INTRAVENOUS | Status: AC
  Administered 2017-03-13: 50 mL
  Filled 2017-03-13: qty 50

## 2017-03-13 MED ORDER — ASPIRIN 300 MG RE SUPP
300.0000 mg | Freq: Every day | RECTAL | Status: DC
  Administered 2017-03-13 – 2017-03-15 (×3): 300 mg via RECTAL
  Filled 2017-03-13 (×4): qty 1

## 2017-03-13 MED ORDER — POTASSIUM CHLORIDE 10 MEQ/100ML IV SOLN
INTRAVENOUS | Status: AC
  Administered 2017-03-13: 10 meq via INTRAVENOUS
  Filled 2017-03-13: qty 300

## 2017-03-13 MED ORDER — KCL IN DEXTROSE-NACL 40-5-0.9 MEQ/L-%-% IV SOLN
INTRAVENOUS | Status: AC
  Administered 2017-03-13: 07:00:00 via INTRAVENOUS
  Filled 2017-03-13: qty 1000

## 2017-03-13 MED ORDER — POTASSIUM CHLORIDE 10 MEQ/100ML IV SOLN
10.0000 meq | INTRAVENOUS | Status: AC
  Administered 2017-03-13 (×3): 10 meq via INTRAVENOUS

## 2017-03-13 MED ORDER — POTASSIUM CHLORIDE 10 MEQ/100ML IV SOLN: 10.0000 meq | INTRAVENOUS | Status: DC

## 2017-03-13 MED ORDER — ASPIRIN EC 325 MG PO TBEC
325.0000 mg | DELAYED_RELEASE_TABLET | Freq: Every day | ORAL | Status: DC
  Administered 2017-03-16 – 2017-03-18 (×3): 325 mg via ORAL
  Filled 2017-03-13 (×3): qty 1

## 2017-03-13 MED ORDER — POTASSIUM CHLORIDE 2 MEQ/ML IV SOLN: INTRAVENOUS | Status: DC

## 2017-03-13 NOTE — Progress Notes (Addendum)
  Speech Language Pathology Treatment: Dysphagia  Patient Details Name: Artis Buechele MRN: [id number] DOB: Jan 13, 1941 Today's Date: 03/13/2017 Time: 4827-0786 SLP Time Calculation (min) (ACUTE ONLY): 13 min  Assessment / Plan / Recommendation Clinical Impression  SLP f/u today for PO readiness. Per RN, responsiveness has been fluctuating. 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 Pt is now DNR; palliative consulted yesterday however per charting family not open to "talk about death" at this time. Today pt is not oriented to self, however SLP facilitated simple conversation during which pt indicated she had two sons and was able to state their names. SLP attempted oral care to maximize safety for PO trials, however pt is orally defensive and refuses oral care. Follows some basic commands and with verbal, tactile cues retrieves 4 small, 1/2 teaspoon boluses of pureed. Pt with prolonged bolus formation, and swallow initiation appears delayed, however no overt signs of aspiration noted. Given her current mental status, consistent signs of aspiration with liquids during yesterday's evaluation, and multiple risk factors for aspiration, pt does not yet appear ready for diet initiation or instrumental testing. SLP will follow closely for readiness for PO intake/MBS as appropriate.     HPI HPI: 76/F PMH of dementia, living by herself found slumped over on a chair. 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 CT Brain shows global atrophy and small vessel ischemic disease. CXR is normal. MRI pending.      SLP Plan  Continue with current plan of care       Recommendations  Diet recommendations: NPO                Oral Care Recommendations: Oral care QID Follow up Recommendations: Other (comment) (TBD) SLP Visit Diagnosis: Dysphagia, unspecified (R13.10) Plan: Continue with  current plan of care       [place], [place], CCC-SLP Speech-Language Pathologist [phone]  Aliene Altes 03/13/2017, 8:58 AM

## 2017-03-13 NOTE — Plan of Care (Signed)
Problem: Safety: Goal: Ability to remain free from injury will improve Outcome: Progressing Discussed with patients about safety mitts remaining on with some teach back displayed.

## 2017-03-13 NOTE — Progress Notes (Signed)
Pt's son, Moraima Burd here at pt's bedside,brought in copy of pt's durable power of attorney. Copy made and placed on pt's chart.   Also notified Dr Kris Mouton of family at bedside and would like an update with results of MRI. Group will be coming up shortly to see family, family informed.

## 2017-03-13 NOTE — Progress Notes (Signed)
Family Medicine Teaching Service Daily Progress Note Intern Pager: [phone]  Patient name: Susan Ryan Medical record number: [id number] Date of birth: 1941/01/12 Age: 77 y.o. Gender: female  Primary Care Provider: Patient, No Pcp Per Consultants: neurology Code Status: full  Pt Overview and Major Events to Date:  7/13: Admitted to FMTS for AMS and mild rhabdo  Assessment and Plan: Susan Ryan is a 76 y.o. female presenting with AMS, having been found confused in . PMH is significant for afib.   AMS: possibly due to stroke vs seizure vs dehydration. Patient not moving left arm on exam and unable to tell me if sensation is intact, so need to rule out stroke. Unsure if she is on any medications at home that could be causing this. History of alcohol abuse per family.  Drug/alcohol intoxication ruled out with negative UDS and serum alcohol. CT head negative, ruling out intracranial bleed. Infection less likely due to UA without leukocytes/nitrite, negative CXR, no fevers. Elevated WBC count likely elevated in the setting of dehydration. Cardiac etiology less likely with negative troponins.  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  EEG negative for seizure activity.  Blood culture shows no growth 2 days. Negative urine culture 1 day.  Vitamin B12 level was 454.  CIWA score = 5 overnight.  CTA head and neck from 7/15 show stable athersclerotic disease, stable atrophy, stable infarct.   -restart D5NS infusion at 100 cc/hr d/t NPO status -Neurology following, appreciate recs -Echo to be done most likely on 7/17 -continue to follow blood and urine cultures -Will monitor for fevers/signs of infection. Hold on antibiotics for now. -Delirium precautions -Neuro checks q2hrs  -CIWA protocol -speech recommends modified barium swallow most likely on 7/16 depending on patient's mental status   -family wants to be called with update on 7/16  Left arm pain: patient refuses to use left  arm and is holding it close to her body. Patient jumps when I attempt to examine her arm. Could be sore 2/2 to rhabdo. Could also not be using her arm in the setting of stroke. Good radial pulse and no edema, so low suspicion for compartment syndrome.  X-rays of left arm and forearm were negative for fracture on 03/12/17.  Rhabdomyolysis, mild: CK on admission 3142. AST also mildly elevated to 70. UA with hemoglobin but no RBCs.  Repeat CK 1,922 from 03/12/17.  Creatinine continues to resolve 1.31-->1.02-->0.75 - restart IVFs (D5NS) at 125cc/hr today?  Dementia: unable to perform ADLs at home.  PT recommended trial of PT for 2 weeks, SNF placement.  Palliative saw family on 7/14, family not interested in palliative care discussion at this time. -Delirium precautions -NPO for now, speech eval pending -OT  HTN: BPs as high as 198/81 overnight. BP 152/91. Goal <160/100 in this demented, elderly patient. - Will monitor and add prn med if significantly elevated.  ?Afib: Concern for A-fib on admission EKG; however, on further evaluation, it appears that she has a regular rate and p waves are noted in most leads. Repeat EKG on 03/12/17 with sinus arrhythmia. Has been in NSR on telemetry overnight. -Monitor on telemetry  Skin breakdown: patient with erythema over genital area, likely due to fecal incontinence as well as sedentary lifestyle.  Wound care saw patient on 03/12/17, recommended cleansing perineum daily, patting dry, and adding Boudreaux butt paste BID.  Hypokalemia:  K+ at 4.0 with K+ supplementation on 03/13/17 -follow up with daily BMPs  EKG changes: New inverted T waves  in leads V1-V5 on EKG from 03/12/17.  Troponins remained negative on 03/13/17.  EKG on 7/15 showed resolved T-wave inversions in leads V2-V5  FEN/GI: NPO per speech recommendations Prophylaxis: heparin subcutaneous as head CT negative for acute bleed  Disposition: Keep in stepdown today due to frequent neuro checks.  Consider transfer to floor on 03/13/17. Ultimately, anticipate discharge to SNF.  Subjective:  Patient denies pain or discomfort.  She is mumbling, sometimes coherently and sometimes incoherently.  Able to respond appropriately to some but not all of my questions  Objective: Temp:  [97.7 F (36.5 C)-98.3 F (36.8 C)] 98 F (36.7 C) (07/16 0842) Pulse Rate:  [56-93] 71 (07/16 0842) Resp:  [12-21] 21 (07/16 0842) BP: (125-198)/(51-139) 173/100 (07/16 0842) SpO2:  [95 %-100 %] 99 % (07/16 0842) Physical Exam: General: cachectic and disheveled female, awake and talking HEENT: [place]/AT, EOMI, dry lips Neck: supple Cardiovascular: RRR, no MRG Respiratory: CTAB, easy WOB  Gastrointestinal: nontender, +BS MSK: does not move left upper extremity but is moving right arm frequently, some tenderness to palpation of the left arm Derm: no rashes or lesions on exposed skin, ecchymosis on left cheek Neuro: awake, alert, oriented to self only, talking but not making sense, no slurred speech, holding left upper extremity against body, can open eyes and extend legs on command Psych: unable to assess  Laboratory:  Recent Labs Lab 03/12/17 1037 03/13/17 0308 03/14/17 0159  WBC 9.0 10.5 8.5  HGB 12.4 13.1 13.4  HCT 38.4 40.0 40.5  PLT 218 217 305    Recent Labs Lab 03/11/17 1722 03/12/17 1037 03/13/17 0308 03/13/17 1750 03/14/17 0159  NA 139 138 137 135 137  K 4.6 3.3* 2.9* 3.1* 4.0  CL 102 111 107 106 106  CO2 23 20* 22 23 24   BUN 26* 17 8 <5* <5*  CREATININE 1.31* 1.02* 0.77 0.68 0.75  CALCIUM 9.0 7.9* 7.8* 7.9* 8.3*  PROT 7.4 5.3*  --   --   --   BILITOT 2.0* 1.5*  --   --   --   ALKPHOS 68 42  --   --   --   ALT 33 27  --   --   --   AST 70* 49*  --   --   --   GLUCOSE 116* 104* 112* 85 109*     Imaging/Diagnostic Tests: CXR- no acute process CT head- atrophy, no acute intracranial abnormality   Ct Angio Head W Or Wo Contrast  Result Date: 03/13/2017 CLINICAL DATA:   Acute left-sided weakness beginning 2 days ago. Acute infarct involving the right basal ganglia and centrum semi ovale. EXAM: CT ANGIOGRAPHY HEAD AND NECK TECHNIQUE: Multidetector CT imaging of the head and neck was performed using the standard protocol during bolus administration of intravenous contrast. Multiplanar CT image reconstructions and MIPs were obtained to evaluate the vascular anatomy. Carotid stenosis measurements (when applicable) are obtained utilizing NASCET criteria, using the distal internal carotid diameter as the denominator. CONTRAST:  50 mL Isovue 370 COMPARISON:  MRI brain 03/12/2017. CT head without contrast 03/11/2017. FINDINGS: CT HEAD FINDINGS Brain: The right basal ganglia and centrum semi ovale acute nonhemorrhagic infarct is stable. No new infarcts are evident. Mild atrophy and white matter disease is unchanged. The ventricles are proportionate to the degree of atrophy. No significant extra-axial fluid collection is present. Vascular: Atherosclerotic calcifications are present within the cavernous internal carotid artery's. There is no hyperdense vessel. Skull: The calvarium is intact. No focal lytic or  blastic lesions are present. Sinuses: The paranasal sinuses and mastoid air cells are clear. Orbits: The globes and orbits are within normal limits bilaterally. Review of the MIP images confirms the above findings CTA NECK FINDINGS Aortic arch: A 3 vessel arch configuration is present. There is no significant atherosclerotic calcification or stenosis at the origins of the great vessels. Right carotid system: The right common carotid artery is within normal limits. Calcifications are noted along the proximal right external carotid artery. The bifurcations otherwise unremarkable. The cervical right ICA is normal. Left carotid system: The left common carotid artery is within normal limits. There is some regularity at the bifurcation without a significant stenosis. The cervical left ICA is  otherwise normal. Vertebral arteries: The vertebral arteries originate from the subclavian arteries without significant stenosis. The right vertebral artery is slightly dominant. There is no significant stenosis of either vertebral artery within the neck. Skeleton: Leftward curvature is present in the cervical spine. Multilevel facet degenerative changes are worse on the right. No focal lytic or blastic lesions are present. The patient is edentulous. Other neck: The soft tissues of the neck are unremarkable. No focal mucosal or submucosal lesions are present. Salivary glands are within normal limits. There is no significant adenopathy. The thyroid is within normal limits. Upper chest: The lung apices are clear. The superior mediastinum is within normal limits. Review of the MIP images confirms the above findings CTA HEAD FINDINGS Anterior circulation: Atherosclerotic changes are noted within the cavernous right internal carotid artery without significant stenosis. There are no significant calcifications on the left. Internal carotid artery is are within normal limits otherwise through the ICA termini. The A1 and M1 segments are normal. The left A1 segment is dominant. The anterior communicating artery is patent. The MCA bifurcations are intact bilaterally. ACA and MCA branch vessels are within normal limits. No focal aneurysm is present. Posterior circulation: The right vertebral artery is dominant. PICA origins are visualized and normal. The basilar artery is within normal limits. The left posterior cerebral artery originates from basilar tip. The right posterior cerebral artery is of fetal type. The PCA branch vessels are within normal limits bilaterally Venous sinuses: The dural sinuses are patent. The straight sinus and deep cerebral veins are patent. Cortical veins are within normal limits. Anatomic variants: Fetal type right posterior cerebral artery. Delayed phase: The postcontrast images demonstrate no  enhancing lesions. Review of the MIP images confirms the above findings IMPRESSION: 1. Stable appearance of acute/subacute right basal ganglia and centrum semiovale nonhemorrhagic infarct. 2. Stable atrophy and white matter disease. 3. Minimal atherosclerotic changes at the left carotid bifurcation and right cavernous internal carotid artery without significant stenosis. 4. Cervical spine scoliosis. Electronically Signed   By: San Morelle M.D.   On: 03/13/2017 18:13   Ct Angio Neck W Or Wo Contrast  Result Date: 03/13/2017 CLINICAL DATA:  Acute left-sided weakness beginning 2 days ago. Acute infarct involving the right basal ganglia and centrum semi ovale. EXAM: CT ANGIOGRAPHY HEAD AND NECK TECHNIQUE: Multidetector CT imaging of the head and neck was performed using the standard protocol during bolus administration of intravenous contrast. Multiplanar CT image reconstructions and MIPs were obtained to evaluate the vascular anatomy. Carotid stenosis measurements (when applicable) are obtained utilizing NASCET criteria, using the distal internal carotid diameter as the denominator. CONTRAST:  50 mL Isovue 370 COMPARISON:  MRI brain 03/12/2017. CT head without contrast 03/11/2017. FINDINGS: CT HEAD FINDINGS Brain: The right basal ganglia and centrum semi ovale acute nonhemorrhagic  infarct is stable. No new infarcts are evident. Mild atrophy and white matter disease is unchanged. The ventricles are proportionate to the degree of atrophy. No significant extra-axial fluid collection is present. Vascular: Atherosclerotic calcifications are present within the cavernous internal carotid artery's. There is no hyperdense vessel. Skull: The calvarium is intact. No focal lytic or blastic lesions are present. Sinuses: The paranasal sinuses and mastoid air cells are clear. Orbits: The globes and orbits are within normal limits bilaterally. Review of the MIP images confirms the above findings CTA NECK FINDINGS Aortic  arch: A 3 vessel arch configuration is present. There is no significant atherosclerotic calcification or stenosis at the origins of the great vessels. Right carotid system: The right common carotid artery is within normal limits. Calcifications are noted along the proximal right external carotid artery. The bifurcations otherwise unremarkable. The cervical right ICA is normal. Left carotid system: The left common carotid artery is within normal limits. There is some regularity at the bifurcation without a significant stenosis. The cervical left ICA is otherwise normal. Vertebral arteries: The vertebral arteries originate from the subclavian arteries without significant stenosis. The right vertebral artery is slightly dominant. There is no significant stenosis of either vertebral artery within the neck. Skeleton: Leftward curvature is present in the cervical spine. Multilevel facet degenerative changes are worse on the right. No focal lytic or blastic lesions are present. The patient is edentulous. Other neck: The soft tissues of the neck are unremarkable. No focal mucosal or submucosal lesions are present. Salivary glands are within normal limits. There is no significant adenopathy. The thyroid is within normal limits. Upper chest: The lung apices are clear. The superior mediastinum is within normal limits. Review of the MIP images confirms the above findings CTA HEAD FINDINGS Anterior circulation: Atherosclerotic changes are noted within the cavernous right internal carotid artery without significant stenosis. There are no significant calcifications on the left. Internal carotid artery is are within normal limits otherwise through the ICA termini. The A1 and M1 segments are normal. The left A1 segment is dominant. The anterior communicating artery is patent. The MCA bifurcations are intact bilaterally. ACA and MCA branch vessels are within normal limits. No focal aneurysm is present. Posterior circulation: The  right vertebral artery is dominant. PICA origins are visualized and normal. The basilar artery is within normal limits. The left posterior cerebral artery originates from basilar tip. The right posterior cerebral artery is of fetal type. The PCA branch vessels are within normal limits bilaterally Venous sinuses: The dural sinuses are patent. The straight sinus and deep cerebral veins are patent. Cortical veins are within normal limits. Anatomic variants: Fetal type right posterior cerebral artery. Delayed phase: The postcontrast images demonstrate no enhancing lesions. Review of the MIP images confirms the above findings IMPRESSION: 1. Stable appearance of acute/subacute right basal ganglia and centrum semiovale nonhemorrhagic infarct. 2. Stable atrophy and white matter disease. 3. Minimal atherosclerotic changes at the left carotid bifurcation and right cavernous internal carotid artery without significant stenosis. 4. Cervical spine scoliosis. Electronically Signed   By: San Morelle M.D.   On: 03/13/2017 18:13   Mr Brain Wo Contrast  Result Date: 03/12/2017 CLINICAL DATA:  Altered mental status. History of hypertension and dementia. Assess for stroke. EXAM: MRI HEAD WITHOUT CONTRAST TECHNIQUE: Multiplanar, multiecho pulse sequences of the brain and surrounding structures were obtained without intravenous contrast. COMPARISON:  CT HEAD March 11, 2017 at 1626 hours FINDINGS: Sequences are moderately or severely motion degraded. BRAIN: 19 x 7  mm reduced diffusion RIGHT basal ganglia to internal capsule with low ADC values. No susceptibility artifact to suggest lobar hematoma though motion degrades sensitivity for micro hemorrhages. Moderate ventriculomegaly on the basis of global parenchymal brain volume loss. Patchy supratentorial white matter FLAIR T2 hyperintensities. No midline shift, mass effect or definite masses though limited by patient motion. No abnormal extra-axial fluid collections. VASCULAR:  Major intracranial vascular flow voids present at skull base. SKULL AND UPPER CERVICAL SPINE: Limited assessment due to motion. No cerebellar tonsillar ectopia. SINUSES/ORBITS: Limited examination. No paranasal sinus air-fluid levels. OTHER: None. IMPRESSION: 1. Moderate to severely motion degraded examination. 2. 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 3. Moderate atrophy in and at at least mild to moderate chronic small vessel ischemic disease. Electronically Signed   By: Elon Alas M.D.   On: 03/12/2017 23:09     Kathrene Alu, MD 03/13/2017, 10:11 PM PGY-1, [place] Intern pager: [phone], text pages welcome

## 2017-03-13 NOTE — Progress Notes (Signed)
Family Medicine Teaching Service Daily Progress Note Intern Pager: [phone]  Patient name: Susan Ryan Medical record number: [id number] Date of birth: 05-Nov-1940 Age: 76 y.o. Gender: female  Primary Care Provider: Patient, No Pcp Per Consultants: neurology Code Status: full  Pt Overview and Major Events to Date:  7/13: Admitted to FMTS for AMS and mild rhabdo  Assessment and Plan: Susan Ryan is a 76 y.o. female presenting with AMS, having been found confused in . PMH is significant for afib.   AMS: possibly due to stroke vs seizure vs dehydration. Patient not moving left arm on exam and unable to tell me if sensation is intact, so need to rule out stroke. Unsure if she is on any medications at home that could be causing this. History of alcohol abuse per family.  Drug/alcohol intoxication ruled out with negative UDS and serum alcohol. CT head negative, ruling out intracranial bleed. Infection less likely due to UA without leukocytes/nitrite, negative CXR, no fevers. Elevated WBC count likely elevated in the setting of dehydration. Cardiac etiology less likely with negative troponins.  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  EEG negative for seizure activity.  Blood culture shows no growth <24 hours.  Vitamin B12 level was 454.  CIWA score = 6 overnight. -switched from NS infusion to D5NS infusion at 125 cc/hr d/t NPO status -Neurology following, have ordered CTA of head and neck, appreciate recs -urine cultures pending, continue to follow blood culture -Will monitor for fevers/signs of infection. Hold on antibiotics for now. -Delirium precautions -Neuro checks q2hrs  -CIWA protocol -speech recommends NPO 03/13/17 then modified barium swallow 03/14/17  Left arm pain: patient refuses to use left arm and is holding it close to her body. Patient jumps when I attempt to examine her arm. Could be sore 2/2 to rhabdo. Could also not be using her arm in the setting of stroke.  Good radial pulse and no edema, so low suspicion for compartment syndrome.  X-rays of left arm and forearm were negative for fracture on 03/12/17.  Rhabdomyolysis, mild: CK on admission 3142. AST also mildly elevated to 70. UA with hemoglobin but no RBCs.  Repeat CK 1,922 from 03/12/17.  Creatinine continues to resolve 1.31-->1.02-->0.77 - continue IVFs (D5NS) at 125cc/hr today.  Dementia: unable to perform ADLs at home.  PT recommended trial of PT for 2 weeks, SNF placement.  Palliative saw family on 7/14, family not interested in palliative care discussion at this time. -Delirium precautions -NPO for now, speech eval pending -OT  HTN: BPs as high as 171/94 overnight. BP 119/66. Goal <160/100 in this demented, elderly patient. - Will monitor and add prn med if significantly elevated.  ?Afib: Concern for A-fib on admission EKG; however, on further evaluation, it appears that she has a regular rate and p waves are noted in most leads. Repeat EKG on 03/12/17 with sinus arrhythmia. Has been in NSR on telemetry overnight. -Monitor on telemetry  Skin breakdown: patient with erythema over genital area, likely due to fecal incontinence as well as sedentary lifestyle.  Wound care saw patient on 03/12/17, recommended cleansing perineum daily, patting dry, and adding Boudreaux butt paste BID.  Hypokalemia:  K+ has decreased daily, 4.6 --> 3.3--> 2.9.  Possibly due to NPO status.  Added 40 mEq KCl to fluids on 03/13/17. -follow up on BMP 7/16  EKG changes: New inverted T waves in leads V1-V5 on EKG from 03/12/17.  -trend troponins x 3 -f/u on am EKG  FEN/GI: NPO  per speech recommendations Prophylaxis: heparin subcutaneous as head CT negative for acute bleed  Disposition: Keep in stepdown today due to frequent neuro checks. Consider transfer to floor on 03/13/17. Ultimately, anticipate discharge to SNF.  Subjective:  Patient can open eyes on command but cannot tell me her name.  She is mumbling  incoherently at times but is more coherent at other times.  Can tell me her son's name.  She was able to move her legs on command.  Objective: Temp:  [97.8 F (36.6 C)-98.6 F (37 C)] 98.4 F (36.9 C) (07/15 0300) Pulse Rate:  [49-96] 52 (07/15 0600) Resp:  [15-22] 15 (07/15 0600) BP: (104-171)/(52-99) 119/66 (07/15 0600) SpO2:  [94 %-100 %] 100 % (07/15 0600) Physical Exam: General: cachectic and disheveled female, awake and talking incoherently HEENT: [place]/AT, EOMI, moist mucous membranes Neck: supple Cardiovascular: RRR, no MRG Respiratory: CTAB, easy WOB  Gastrointestinal: nontender, +BS MSK: does not move left upper extremity but is moving right arm frequently, no tenderness to palpation of the left arm Derm: no rashes or lesions on exposed skin, ecchymosis on left cheek Neuro: awake, alert, does not answer questions of orientation, talking but not making sense, no slurred speech, holding left upper extremity against body, can open eyes and extend legs on command Psych: unable to assess  Laboratory:  Recent Labs Lab 03/11/17 1722 03/12/17 1037 03/13/17 0308  WBC 13.5* 9.0 10.5  HGB 14.6 12.4 13.1  HCT 44.8 38.4 40.0  PLT 299 218 217    Recent Labs Lab 03/11/17 1722 03/12/17 1037 03/13/17 0308  NA 139 138 137  K 4.6 3.3* 2.9*  CL 102 111 107  CO2 23 20* 22  BUN 26* 17 8  CREATININE 1.31* 1.02* 0.77  CALCIUM 9.0 7.9* 7.8*  PROT 7.4 5.3*  --   BILITOT 2.0* 1.5*  --   ALKPHOS 68 42  --   ALT 33 27  --   AST 70* 49*  --   GLUCOSE 116* 104* 112*     Imaging/Diagnostic Tests: CXR- no acute process CT head- atrophy, no acute intracranial abnormality   Dg Forearm Left  Result Date: 03/12/2017 CLINICAL DATA:  Left arm pain.  No known injury.  Dementia. EXAM: LEFT FOREARM - 2 VIEW COMPARISON:  None. FINDINGS: Proximal soft tissue swelling or prominent musculature. No fracture or dislocation. IMPRESSION: No fracture. Electronically Signed   By: Claudie Revering M.D.    On: 03/12/2017 12:06   Mr Brain Wo Contrast  Result Date: 03/12/2017 CLINICAL DATA:  Altered mental status. History of hypertension and dementia. Assess for stroke. EXAM: MRI HEAD WITHOUT CONTRAST TECHNIQUE: Multiplanar, multiecho pulse sequences of the brain and surrounding structures were obtained without intravenous contrast. COMPARISON:  CT HEAD March 11, 2017 at 1626 hours FINDINGS: Sequences are moderately or severely motion degraded. BRAIN: 19 x 7 mm reduced diffusion RIGHT basal ganglia to internal capsule with low ADC values. No susceptibility artifact to suggest lobar hematoma though motion degrades sensitivity for micro hemorrhages. Moderate ventriculomegaly on the basis of global parenchymal brain volume loss. Patchy supratentorial white matter FLAIR T2 hyperintensities. No midline shift, mass effect or definite masses though limited by patient motion. No abnormal extra-axial fluid collections. VASCULAR: Major intracranial vascular flow voids present at skull base. SKULL AND UPPER CERVICAL SPINE: Limited assessment due to motion. No cerebellar tonsillar ectopia. SINUSES/ORBITS: Limited examination. No paranasal sinus air-fluid levels. OTHER: None. IMPRESSION: 1. Moderate to severely motion degraded examination. 2. Acute 19 x 7 mm RIGHT  basal ganglia to internal capsule infarct. 3. Moderate atrophy in and at at least mild to moderate chronic small vessel ischemic disease. Electronically Signed   By: Elon Alas M.D.   On: 03/12/2017 23:09   Dg Humerus Left  Result Date: 03/12/2017 CLINICAL DATA:  Left arm pain.  No known injury. EXAM: LEFT HUMERUS - 2+ VIEW COMPARISON:  None. FINDINGS: There is no evidence of fracture or other focal bone lesions. Soft tissues are unremarkable. IMPRESSION: Normal examination. Electronically Signed   By: Claudie Revering M.D.   On: 03/12/2017 12:05     Kathrene Alu, MD 03/13/2017, 6:27 AM PGY-1, [facility] Intern pager: [phone],  text pages welcome

## 2017-03-13 NOTE — Progress Notes (Signed)
STROKE TEAM PROGRESS NOTE   HISTORY OF PRESENT ILLNESS (per record) Susan Ryan is an 76 y.o. female with a history of dementia but lives alone at home.  She was found yesterday at 5 pm slumped over the left side onto a chair.  It is not clear how long she had been doing that, but she has rhadbomyolysis with renal failure.  CT Brain shows global atrophy and small vessel ischemic disease.  CXR is normal.  CK 3142, GFR 40,  WBC 13.5 and cultures pending.     SUBJECTIVE (INTERVAL HISTORY) No family is at the bedside. Pt lying in bed with mitten. Not cooperative on exam, severe dysarthria. Noted form notes that pt family refused palliative discussion, will continue stroke work up.    OBJECTIVE Temp:  [97.8 F (36.6 C)-98.6 F (37 C)] 98.4 F (36.9 C) (07/15 0300) Pulse Rate:  [49-96] 63 (07/15 0700) Cardiac Rhythm: Normal sinus rhythm (07/14 1943) Resp:  [15-22] 20 (07/15 0700) BP: (104-171)/(52-99) 133/84 (07/15 0700) SpO2:  [94 %-100 %] 100 % (07/15 0700)  CBC:  Recent Labs Lab 03/11/17 1722 03/12/17 1037 03/13/17 0308  WBC 13.5* 9.0 10.5  NEUTROABS 11.5*  --   --   HGB 14.6 12.4 13.1  HCT 44.8 38.4 40.0  MCV 89.2 90.4 89.1  PLT 299 218 646    Basic Metabolic Panel:  Recent Labs Lab 03/12/17 1037 03/13/17 0308  NA 138 137  K 3.3* 2.9*  CL 111 107  CO2 20* 22  GLUCOSE 104* 112*  BUN 17 8  CREATININE 1.02* 0.77  CALCIUM 7.9* 7.8*    Lipid Panel: No results found for: CHOL, TRIG, HDL, CHOLHDL, VLDL, LDLCALC HgbA1c: No results found for: HGBA1C Urine Drug Screen:    Component Value Date/Time   LABOPIA NONE DETECTED 03/11/2017 1551   COCAINSCRNUR NONE DETECTED 03/11/2017 1551   LABBENZ NONE DETECTED 03/11/2017 1551   AMPHETMU NONE DETECTED 03/11/2017 1551   THCU NONE DETECTED 03/11/2017 1551   LABBARB NONE DETECTED 03/11/2017 1551    Alcohol Level     Component Value Date/Time   ETH <5 03/11/2017 1722    IMAGING I have personally reviewed the radiological  images below and agree with the radiology interpretations.  Ct Head Wo Contrast 03/11/2017 \\Mild  diffuse cortical atrophy. Mild chronic ischemic white matter disease. No acute intracranial abnormality seen.   Mr Brain Wo Contrast 03/12/2017 1. Moderate to severely motion degraded examination.  2. 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  3. Moderate atrophy in and at at least mild to moderate chronic small vessel ischemic disease.   CTA head and neck pending  TTE pending   PHYSICAL EXAM  Temp:  [97 F (36.1 C)-98.6 F (37 C)] 97 F (36.1 C) (07/15 0830) Pulse Rate:  [49-96] 83 (07/15 0911) Resp:  [15-22] 15 (07/15 0911) BP: (104-175)/(52-99) 175/86 (07/15 0911) SpO2:  [94 %-100 %] 100 % (07/15 0911)  General - Thin, well developed, lethargic  Ophthalmologic - Fundi not visualized due to noncooperation.  Cardiovascular - Regular rate and rhythm.  Exam limited due to dementia and noncooperation. Not able to answer orientation questions, but can say "I don't know" when asked for age, however, very dysarthria. Pt then agitated for further exam, not open eyes for pupil exam, with forced eye opening pt became combative, but PERRL, able to move to both sides, blinking to visual threat on the left but inconsistent on the right. Facial no significant asymmetry. Moving  all extremities on stimulation, no significant lateral signs. DTR 1+ and babinski not cooperative. Sensation, coordination and gait not tested.   ASSESSMENT/PLAN [name] is a 77 y.o. female with history of EtOH abuse history, dementia and hypertension who was found slumped over in a chair at home with altered mental status. She did not receive IV t-PA due to late presentation.  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  Resultant  Baseline dementia  CT head - atrophy - no acute abnormality.  MRI head - Acute 19 x 7 mm RIGHT basal ganglia to internal  capsule infarct.  CTA H&N - pending  2D Echo - pending  LDL - 64  HgbA1c - pending  VTE prophylaxis - subcutaneous heparin  Diet NPO time specified  No antithrombotic prior to admission, now on aspirin 300 mg suppository daily.   Patient will be counseled to be compliant with her antithrombotic medications  Ongoing aggressive stroke risk factor management  Therapy recommendations:  pending  Disposition: Pending  AKI / rhabdomyolysis - resolved  Cre 1.02->0.77  CK 3142->1922  On IVF  Hypertension  Stable  Permissive hypertension (OK if < 220/120) but gradually normalize in 5-7 days  Long-term BP goal normotensive  Other Stroke Risk Factors  Advanced age  Other Active Problems  Hypokalemia - 2.9  Mildly elevated TSH - 4.686  Hypocalcemia - 7.8  Family refused palliative discussion, will continue stroke work up  Hospital day # 2  Rosalin Hawking, MD PhD Stroke Neurology 03/13/2017 11:20 AM   To contact Stroke Continuity provider, please refer to [email or web address] After hours, contact General Neurology

## 2017-03-14 ENCOUNTER — Inpatient Hospital Stay (HOSPITAL_COMMUNITY): Payer: Medicare Other

## 2017-03-14 LAB — CBC
HEMATOCRIT: 40.5 % (ref 36.0–46.0)
HEMOGLOBIN: 13.4 g/dL (ref 12.0–15.0)
MCH: 29.3 pg (ref 26.0–34.0)
MCHC: 33.1 g/dL (ref 30.0–36.0)
MCV: 88.4 fL (ref 78.0–100.0)
Platelets: 305 10*3/uL (ref 150–400)
RBC: 4.58 MIL/uL (ref 3.87–5.11)
RDW: 13.8 % (ref 11.5–15.5)
WBC: 8.5 10*3/uL (ref 4.0–10.5)

## 2017-03-14 LAB — BASIC METABOLIC PANEL
ANION GAP: 7 (ref 5–15)
BUN: <5 mg/dL — ABNORMAL LOW (ref 6–20)
CHLORIDE: 106 mmol/L (ref 101–111)
CO2: 24 mmol/L (ref 22–32)
Calcium: 8.3 mg/dL — ABNORMAL LOW (ref 8.9–10.3)
Creatinine, Ser: 0.75 mg/dL (ref 0.44–1.00)
GFR calc non Af Amer: >60 mL/min (ref >60)
GLUCOSE: 109 mg/dL — AB (ref 65–99)
POTASSIUM: 4 mmol/L (ref 3.5–5.1)
Sodium: 137 mmol/L (ref 135–145)

## 2017-03-14 LAB — HIV ANTIBODY (ROUTINE TESTING W REFLEX): HIV Screen 4th Generation wRfx: NONREACTIVE

## 2017-03-14 LAB — RPR: RPR Ser Ql: NONREACTIVE

## 2017-03-14 MED ORDER — HYDRALAZINE HCL 20 MG/ML IJ SOLN
5.0000 mg | INTRAMUSCULAR | Status: DC | PRN
Start: 2017-03-14 — End: 2017-03-16
  Administered 2017-03-14: 5 mg via INTRAVENOUS
  Filled 2017-03-14: qty 1

## 2017-03-14 MED ORDER — DEXTROSE-NACL 5-0.9 % IV SOLN
INTRAVENOUS | Status: DC
  Administered 2017-03-14 – 2017-03-15 (×3): via INTRAVENOUS

## 2017-03-14 MED ORDER — RESOURCE THICKENUP CLEAR PO POWD
ORAL | Status: DC | PRN
  Filled 2017-03-14: qty 125

## 2017-03-14 NOTE — Progress Notes (Signed)
STROKE TEAM PROGRESS NOTE   HISTORY OF PRESENT ILLNESS (per record) Susan Ryan is an 76 y.o. female with a history of dementia but lives alone at home.  She was found yesterday at 5 pm slumped over the left side onto a chair.  It is not clear how long she had been doing that, but she has rhadbomyolysis with renal failure.  CT Brain shows global atrophy and small vessel ischemic disease.  CXR is normal.  CK 3142, GFR 40,  WBC 13.5 and cultures pending.     SUBJECTIVE (INTERVAL HISTORY) No family is at the bedside. Pt lying in bed with mitten.  CTA head and neck unremarkable. Patient is more awake alert than yesterday. Still not cooperative on exam, severe dysarthria.   OBJECTIVE Temp:  [97.7 F (36.5 C)-98.3 F (36.8 C)] 98 F (36.7 C) (07/16 1150) Pulse Rate:  [56-93] 71 (07/16 1150) Cardiac Rhythm: Normal sinus rhythm (07/16 0815) Resp:  [12-21] 17 (07/16 1150) BP: (109-198)/(51-139) 109/104 (07/16 1150) SpO2:  [95 %-100 %] 100 % (07/16 1150)  CBC:  Recent Labs Lab 03/11/17 1722  03/13/17 0308 03/14/17 0159  WBC 13.5*  < > 10.5 8.5  NEUTROABS 11.5*  --   --   --   HGB 14.6  < > 13.1 13.4  HCT 44.8  < > 40.0 40.5  MCV 89.2  < > 89.1 88.4  PLT 299  < > 217 305  < > = values in this interval not displayed.  Basic Metabolic Panel:   Recent Labs Lab 03/13/17 1750 03/14/17 0159  NA 135 137  K 3.1* 4.0  CL 106 106  CO2 23 24  GLUCOSE 85 109*  BUN <5* <5*  CREATININE 0.68 0.75  CALCIUM 7.9* 8.3*    Lipid Panel:     Component Value Date/Time   CHOL 150 03/13/2017 0936   TRIG 125 03/13/2017 0936   HDL 61 03/13/2017 0936   CHOLHDL 2.5 03/13/2017 0936   VLDL 25 03/13/2017 0936   LDLCALC 64 03/13/2017 0936   HgbA1c: No results found for: HGBA1C Urine Drug Screen:     Component Value Date/Time   LABOPIA NONE DETECTED 03/11/2017 1551   COCAINSCRNUR NONE DETECTED 03/11/2017 1551   LABBENZ NONE DETECTED 03/11/2017 1551   AMPHETMU NONE DETECTED 03/11/2017 1551    THCU NONE DETECTED 03/11/2017 1551   LABBARB NONE DETECTED 03/11/2017 1551    Alcohol Level     Component Value Date/Time   ETH <5 03/11/2017 1722    IMAGING I have personally reviewed the radiological images below and agree with the radiology interpretations.  Ct Head Wo Contrast 03/11/2017 \\Mild  diffuse cortical atrophy. Mild chronic ischemic white matter disease. No acute intracranial abnormality seen.   Mr Brain Wo Contrast 03/12/2017 1. Moderate to severely motion degraded examination.  2. 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  3. Moderate atrophy in and at at least mild to moderate chronic small vessel ischemic disease.   Ct Angio Head W Or Wo Contrast 03/13/2017 IMPRESSION: 1. Stable appearance of acute/subacute right basal ganglia and centrum semiovale nonhemorrhagic infarct. 2. Stable atrophy and white matter disease. 3. Minimal atherosclerotic changes at the left carotid bifurcation and right cavernous internal carotid artery without significant stenosis. 4. Cervical spine scoliosis.   TTE pending   PHYSICAL EXAM  Temp:  [97.7 F (36.5 C)-98.3 F (36.8 C)] 98 F (36.7 C) (07/16 1150) Pulse Rate:  [56-93] 71 (07/16 1150) Resp:  [12-21] 17 (  07/16 1150) BP: (109-198)/(51-139) 109/104 (07/16 1150) SpO2:  [95 %-100 %] 100 % (07/16 1150)  General - Thin, well developed, eyes open, more radicular  Ophthalmologic - Fundi not visualized due to noncooperation.  Cardiovascular - Regular rate and rhythm.  Exam limited due to dementia and noncooperation. Severe dysarthria, not intelligible for orientation questions, but answered her age of "83". PERRL, able to move to both sides, blinking to visual threat on the left but inconsistent on the right. Facial no significant asymmetry. Moving right UE and BLE purposefully, but LUE seems flaccid. On pain stimulation to left UE, pt became irritated and tried to kick me with legs and hit me with right hand.  DTR and babinski not cooperative. Sensation, coordination and gait not tested.   ASSESSMENT/PLAN [name] is a 76 y.o. female with history of EtOH abuse history, dementia and hypertension who was found slumped over in a chair at home with altered mental status. She did not receive IV t-PA due to late presentation.  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  Resultant  Baseline dementia  CT head - atrophy - no acute abnormality.  MRI head - Acute 19 x 7 mm RIGHT basal ganglia to internal capsule infarct.  CTA H&N - unremarkable  2D Echo - pending  LDL - 64  HgbA1c - pending  VTE prophylaxis - subcutaneous heparin DIET - DYS 1 Room service appropriate? Yes; Fluid consistency: Honey Thick  No antithrombotic prior to admission, now on aspirin 300 mg suppository daily.  continue aspirin on discharge  Patient will be counseled to be compliant with her antithrombotic medications  Ongoing aggressive stroke risk factor management  Therapy recommendations:  SNF  Disposition: Pending  AKI / rhabdomyolysis - resolved  Cre 1.02->0.77->0.75  CK 3142->1922  On dysphagia 1 and Honey thick liquid  Hypertension  Stable Long-term BP goal normotensive  Other Stroke Risk Factors  Advanced age  Other Active Problems  Hypokalemia - 2.9  Mildly elevated TSH - 4.686  Hypocalcemia - 7.8  Noted that family refused palliative discussion.  Hospital day # 3   Neurology will sign off. Please call with questions. Pt will follow up with Cecille Rubin, NP, at [facility] in about 6 weeks. Thanks for the consult.   Rosalin Hawking, MD PhD Stroke Neurology 03/14/2017 12:49 PM   To contact Stroke Continuity provider, please refer to [email or web address] After hours, contact General Neurology

## 2017-03-14 NOTE — Care Management Note (Addendum)
Case Management Note  Patient Details  Name: Susan Ryan MRN: [id number] Date of Birth: 11-13-1940  Subjective/Objective:  From home alone, hx etoh abuse, dementia ,she has one son  found slumped over in chair in feces, presents with, right basa ganglia infarct,  AMS, Rhabdo, Renal Failure. Plan is for SNF placement. CSW referral.   Action/Plan: NCM will follow along with CSW for dc needs.   Expected Discharge Date:  03/16/17               Expected Discharge Plan:  Skilled Nursing Facility  In-House Referral:  Clinical Social Work  Discharge planning Services  CM Consult  Post Acute Care Choice:    Choice offered to:     DME Arranged:    DME Agency:     HH Arranged:    [place] Agency:     Status of Service:  Completed, signed off  If discussed at [employer], dates discussed:    Additional Comments:  Zenon Mayo, RN 03/14/2017, 5:47 PM

## 2017-03-14 NOTE — Progress Notes (Signed)
  Speech Language Pathology Treatment: Dysphagia  Patient Details Name: Semya Klinke MRN: [id number] DOB: 01/29/1941 Today's Date: 03/14/2017 Time: 4098-1191 SLP Time Calculation (min) (ACUTE ONLY): 17 min  Assessment / Plan / Recommendation Clinical Impression  Pt seen sitting edge of bed with OT. Pt inattentive to environment, requires assist to self feed and will only initiate/attend to PO briefly when aware of cup in hand. SLP able to facilitate sips of water with immediate coughing, likely indicative of aspiration before the swallow. Further trials with nectar more successful, though one cough noted out of 6 trials. Will proceed with conservative diet of honey thick liquids and pureed solids though aspiration is possible. Objective testing would be futile as pt would not participate well in testing given mentation. Recommend initiating diet and monitoring for tolerance. Max assist will be needed for intake and poor nutrition is expected. Discussed with RN.   HPI HPI: 76/F PMH of dementia, living by herself found slumped over on a chair. 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 CT Brain shows global atrophy and small vessel ischemic disease. CXR is normal. MRI pending.      SLP Plan  Continue with current plan of care       Recommendations  Diet recommendations: Honey-thick liquid;Dysphagia 1 (puree) Liquids provided via: Cup;Straw;Teaspoon Medication Administration: Crushed with puree Supervision: Staff to assist with self feeding;Full supervision/cueing for compensatory strategies Compensations: Slow rate;Small sips/bites Postural Changes and/or Swallow Maneuvers: Seated upright 90 degrees;Upright 30-60 min after meal                Oral Care Recommendations: Oral care BID Follow up Recommendations: Skilled Nursing facility SLP Visit Diagnosis: Dysphagia, unspecified (R13.10) Plan: Continue with current  plan of care       GO                Kamrie Fanton, Katherene Ponto 03/14/2017, 11:48 AM

## 2017-03-14 NOTE — Progress Notes (Addendum)
Spoke with [place] at [phone], who is the family's contact for receiving updates about Ms. Loletha Grayer.  I informed her of the results of the CTA and told her that we are waiting on the Echo, which will most likely be done tomorrow.  I told her that her mental status is waxing and waning and that she was able to vocalize that she was not in any pain or discomfort this morning, but she could not answer all of my questions.  She asked if she was still having trouble with her left side, and I said that she continues to keep her left arm close to her torso and cannot move it on command; however, she straightened her left leg yesterday.  I assured her that we will keep her informed of any new developments.

## 2017-03-14 NOTE — Telephone Encounter (Signed)
See Ethics Note in hospital admission encounter

## 2017-03-14 NOTE — Ethics Note (Signed)
Ethics Consult Note: Answered page from attending Dr. Chrisandra Netters at 5:41 PM 03/12/17   Dr. Ardelia Mems described the patient as 35yoF admitted with AMS likely CVA (MRI pending) in addition to rhabdo - pt found at home by son. No Advance Directive available. Son is an only child and he and his wife are the patient's only available family - they actively help care for the patient though do not live with her. At time of admission, the admitting team advised the family that, given the patient's condition, her quality of life would likely not be improved by resuscitation efforts and she may not survive CPR if she were to experience an arrest. Her son was initially reluctant to make a decision and the patient remained Full Code. Palliative Care was also consulted, the son was reluctant to discuss matters of death/dying with them.   Overnight, son and his wife apparently discussed the matter further, and the patient's son decided to update Code Status to DO NOT RESUSCITATE, which was discussed with Dr. Ardelia Mems today. At the end of this discussion, the son became emotional and left the room. His wife mentioned to Dr. Ardelia Mems that he was emotional because he had been drinking, however, daily drinking is typical for him. Dr. Ardelia Mems noted that she had no suspicion he was impaired or had been drinking until his wife mentioned this - he seemed of sound mind during their discussion.   Dr. Ardelia Mems contacted me to discuss whether a directive for DNR could be honored if the surrogate decision-maker's capacity was in question due to possible alcohol intoxication, though an argument could also be made that resuscitative efforts would be futile in this patient's case regardless of the surrogate's desires. She has also attempted to contact Risk Management department for the hospital to discuss the matter but was unable to reach anyone as of yet.   I advised that more information was needed to really determine the  son's capacity and ensure he has the patient's best interests in mind, regardless of whether or not he has been drinking today - for instance:  Is he feeling pressured into this decision, is he going along with what the medical team has suggested (subject to coercion or paternalism), or...   Does he truly understand the risks versus benefits as well as the likely outcomes of resuscitative efforts in his mother's case (can he give informed consent), and   Does this decision align with what his mother would want for herself if she had the ability to make the decision, basically is this what she would want (respecting patient autonomy)  When speaking with a surrogate decision maker, it is not commonplace for an attending physician or other member of the medical team to perform a full evaluation of that surrogate's mental capacity, as long as their demeanor and thought content suggests that they are not grossly impaired either by intoxication or mental disorder or learning impediment other cause. If he was/is not obviously impaired based on the judgment of the attending physician who spoke with him, and if he is at his mental/behavioral baseline, I think he could still reasonably give informed consent to DNR.   It is also worth considering that given the patient's likely terminal dementia and other illnesses, administering likely futile resuscitative efforts may not be advised at any rate. Though this patient does not appear to be in immediate danger of decompensation based on her current clinical status, I suggested Dr. Ardelia Mems review the [place] to  see if this would apply to the patient's situation.   Dr. Ardelia Mems called me back at 6:06 PM after speaking with the son further over the phone. He told her that he has had about 6-8 beers today, which is typical intake for him. Though he and his mother never really discussed specifics of end-of-life care while she was still lucid, he  stated that if resuscitative efforts were unlikely to improve her situation, then he wouldn't want to put her through that.     Based on the above information, I suggest the following:  If the patient's son, who is the only legal surrogate decision-maker, can reasonably give informed consent and is not grossly impaired, is making medically reasonable decisions, and has the patient's best interests in mind as he makes decisions for her, then the medical team may respect his directives.  ? It sounds like the medical team and the patient's family are in agreement that resuscitative efforts would not be desired for this patient in the event of an arrest/decompensation.  ? At any rate, if he IS ever grossly impaired or thought to lack capacity...   the medical team can get input from his wife but she would not be the legal surrogate (unless she can produce a valid advance directive or HCPOA stating otherwise)  decisions may fall to the attending physician anyway in the absence of another legal surrogate (though the surrogate's decisions made while lucid should also still be respected if they were to lose capacity themselves), so I would also suggest...  Review the hospital's policy on medical futility and determine if this applies to the patient's situation    The palliative care team may want to revisit this family to discuss outpatient hospice or home hospice services.  Attending physician and the rest of the medical team should revisit the discussion of code status and determine specific goals of care with the family as they deem appropriate  . ? Would also clarify exactly what resuscitative efforts the son would or would not be okay with - medications, oxygen support via non-invasive means versus intubation, chest compressions/defibrillation, surgery, etc.  ? Advised that the completion of MOST form might be helpful prior to discharge  I will discuss the case with 1-2 other members of the  Ethics Committee and check back with Dr. Ardelia Mems. The committee is available for family meeting if needed, and I am available either by the pager or on my cell phone - number was given to Dr. Ardelia Mems.    Update: Spoke to Dr. Ardelia Mems 03/13/2017 evening - no updates to report Only further advise at this time would be if there is any true concern for the surrogate decision-maker's capacity/competence, could get courts involved to determine competence and consider assign a guardian but this is a judgment call. Recommend follow-up with palliative care and discussion of code status as needed.    Thanks so much for the interesting consult!  Please follow-up with Ethics Committee as needed.

## 2017-03-14 NOTE — Evaluation (Signed)
Occupational Therapy Evaluation Patient Details Name: Susan Ryan MRN: [id number] DOB: [birth year] Today's Date: 03/14/2017    History of Present Illness 76 yo F with PMhx of dementia and hypertension admitted 03/11/17 with altered mental status and rhabdomyolysis, after being found slumped sideways in a chair by her family.   Clinical Impression   Pt was living alone and had assistance from her family for IADL and bathing at times per chart review. On OT arrival, pt intently chewing on mitt but willing to participate with therapist. She was able to complete bed mobility with mod assist +2 to sit at EOB for ADL participation. Pt was however resistant to OOB mobility attempts this session despite multiple attempts to motivate and reassure. She currently requires total assist for LB ADL and mod assist for UB ADL and grooming tasks. She presents with L sided weakness as well as significant confusion and decreased problem solving skills and unsure of cognitive baseline at this time. She would benefit from continued OT services while admitted in order to improve independence with ADL and functional mobility. Recommend SNF level rehabilitation post-acute D/C in order to maximize safety and return to independence.    Follow Up Recommendations  SNF;Supervision/Assistance - 24 hour    Equipment Recommendations  Other (comment) (TBD)    Recommendations for Other Services       Precautions / Restrictions Precautions Precautions: Fall Precaution Comments: patient confused and can be agitated. Restrictions Weight Bearing Restrictions: No      Mobility Bed Mobility Overal bed mobility: Needs Assistance Bed Mobility: Supine to Sit;Sit to Supine     Supine to sit: Mod assist;+2 for safety/equipment Sit to supine: +2 for safety/equipment;Max assist   General bed mobility comments: When motivated, pt willing to sit at EOB to participate in ADL and was able to complete with moderate physical  assistance. Pt reluctant to return to supine requiring increased assistance to return to safe position.   Transfers                      Balance Overall balance assessment: Needs assistance Sitting-balance support: No upper extremity supported;Feet supported Sitting balance-Leahy Scale: Fair                                     ADL either performed or assessed with clinical judgement   ADL Overall ADL's : Needs assistance/impaired Eating/Feeding: Minimal assistance;Sitting Eating/Feeding Details (indicate cue type and reason): Pt able to hold cup and bring to her mouth with SLP in room.  Grooming: Moderate assistance   Upper Body Bathing: Maximal assistance;Sitting   Lower Body Bathing: Total assistance   Upper Body Dressing : Maximal assistance;Sitting   Lower Body Dressing: Total assistance     Toilet Transfer Details (indicate cue type and reason): Pt becoming agitated when asked to attempt to stand, swinging at therapist. Attempted multiple approaches to assure pt that therapist and tech had best interest at heart and would not allow her to fall.  Toileting- Clothing Manipulation and Hygiene: Total assistance         General ADL Comments: When met where she is, pt was willing to participate with therapist. However, does become agitated at times requiring reassurance.      Vision   Additional Comments: Will continue to assess.     Perception     Praxis      Pertinent Vitals/Pain Pain Assessment: Faces  Faces Pain Scale: Hurts even more Pain Location: back and R UE with movement Pain Descriptors / Indicators: Moaning;Grimacing Pain Intervention(s): Limited activity within patient's tolerance;Monitored during session;Repositioned     Hand Dominance     Extremity/Trunk Assessment Upper Extremity Assessment Upper Extremity Assessment: Difficult to assess due to impaired cognition (Moving L less often than R)   Lower Extremity  Assessment Lower Extremity Assessment: Defer to PT evaluation       Communication Communication Communication: Expressive difficulties   Cognition Arousal/Alertness: Awake/alert Behavior During Therapy: Agitated;Restless Overall Cognitive Status: Impaired/Different from baseline Area of Impairment: Orientation;Attention;Memory;Safety/judgement;Problem solving;Following commands                 Orientation Level: Disoriented to;Place;Time;Situation Current Attention Level: Focused Memory: Decreased short-term memory Following Commands: Follows multi-step commands inconsistently Safety/Judgement: Decreased awareness of safety   Problem Solving: Slow processing;Difficulty sequencing General Comments: Pt able to converse some with OT although difficult to understand. Pt confused and perseverating on topic of money reporting repeatedly "we are not poor, why are you here?" and "why do you need to make money." Assured pt that therapist is not interested in money.    General Comments       Exercises     Shoulder Instructions      Home Living Family/patient expects to be discharged to:: Unsure Living Arrangements: Alone (family close by) Available Help at Discharge: Family;Available PRN/intermittently Type of Home: Mobile home Home Access: Stairs to enter Entrance Stairs-Number of Steps: 4 Entrance Stairs-Rails: Right;Left Home Layout: One level               Home Equipment: None   Additional Comments: Pt unable to provide accurate information at time of evaluation. PLOF and home living information taken from PT note.      Prior Functioning/Environment Level of Independence: Needs assistance  Gait / Transfers Assistance Needed: Ambulates independently. ADL's / Homemaking Assistance Needed: Family assist with meal prep.  Family assists with bathing - only periodically due to patient not fully cooperating.  Patient incontinent and family cleans house.             OT Problem List: Decreased strength;Decreased range of motion;Decreased activity tolerance;Impaired balance (sitting and/or standing);Decreased coordination;Decreased cognition;Decreased safety awareness;Decreased knowledge of use of DME or AE;Decreased knowledge of precautions;Impaired UE functional use;Pain      OT Treatment/Interventions: Self-care/ADL training;Therapeutic exercise;Energy conservation;DME and/or AE instruction;Therapeutic activities;Patient/family education;Balance training;Cognitive remediation/compensation;Visual/perceptual remediation/compensation    OT Goals(Current goals can be found in the care plan section) Acute Rehab OT Goals Patient Stated Goal: Unable to state OT Goal Formulation: Patient unable to participate in goal setting Time For Goal Achievement: 03/28/17 Potential to Achieve Goals: Good ADL Goals Pt Will Perform Grooming: with min guard assist;sitting Pt Will Transfer to Toilet: with min assist;stand pivot transfer;bedside commode Pt Will Perform Toileting - Clothing Manipulation and hygiene: with min assist;sit to/from stand Pt/caregiver will Perform Home Exercise Program: Left upper extremity;With minimal assist;With written HEP provided;Increased strength;Increased ROM Additional ADL Goal #1: Pt will follow 3/4 multi-step commands during seated grooming tasks with no more than 1 verbal cue.  Additional ADL Goal #2: Pt will demonstrate emergent awareness during morning ADL routine in a minimally distracting environment.   OT Frequency: Min 2X/week   Barriers to D/C:            Co-evaluation              AM-PAC PT "6 Clicks" Daily Activity     Outcome Measure  Help from another person eating meals?: A Little Help from another person taking care of personal grooming?: A Lot Help from another person toileting, which includes using toliet, bedpan, or urinal?: A Lot Help from another person bathing (including washing, rinsing, drying)?: A  Lot Help from another person to put on and taking off regular upper body clothing?: A Lot Help from another person to put on and taking off regular lower body clothing?: Total 6 Click Score: 12   End of Session Nurse Communication: Mobility status  Activity Tolerance: Patient tolerated treatment well Patient left: in bed;with call bell/phone within reach;with bed alarm set (with SLP in room)  OT Visit Diagnosis: Hemiplegia and hemiparesis;Cognitive communication deficit (R41.841);Muscle weakness (generalized) (M62.81);Other abnormalities of gait and mobility (R26.89) Symptoms and signs involving cognitive functions: Cerebral infarction Hemiplegia - Right/Left: Left Hemiplegia - caused by: Cerebral infarction                Time: 0104-0459 OT Time Calculation (min): 25 min Charges:  OT General Charges $OT Visit: 1 Procedure OT Evaluation $OT Eval Moderate Complexity: 1 Procedure OT Treatments $Self Care/Home Management : 8-22 mins G-Codes:     Norman Herrlich, MS OTR/L  Pager: [place] A Johsua Shevlin 03/14/2017, 5:43 PM

## 2017-03-15 ENCOUNTER — Inpatient Hospital Stay (HOSPITAL_COMMUNITY): Payer: Medicare Other

## 2017-03-15 DIAGNOSIS — I639 Cerebral infarction, unspecified: Secondary | ICD-10-CM

## 2017-03-15 LAB — ECHOCARDIOGRAM COMPLETE
CHL CUP DOP CALC LVOT VTI: 19.2 cm
CHL CUP MV DEC (S): 306
E decel time: 306 msec
EERAT: 11.24
FS: 34 % (ref 28–44)
Height: 62 in
IVS/LV PW RATIO, ED: 0.83
LADIAMINDEX: 1.9 cm/m2
LASIZE: 27 mm
LAVOLA4C: 31.4 mL
LDCA: 2.27 cm2
LEFT ATRIUM END SYS DIAM: 27 mm
LV E/e' medial: 11.24
LV PW d: 9.69 mm — AB (ref 0.6–1.1)
LV TDI E'LATERAL: 5.87
LV TDI E'MEDIAL: 5
LVEEAVG: 11.24
LVELAT: 5.87 cm/s
LVOT SV: 44 mL
LVOTD: 17 mm
LVOTPV: 78.3 cm/s
Lateral S' vel: 11.4 cm/s
MV pk A vel: 88.7 m/s
MVPKEVEL: 66 m/s
TAPSE: 17.3 mm
Weight: 1590.84 oz

## 2017-03-15 LAB — CBC
HEMATOCRIT: 40.3 % (ref 36.0–46.0)
HEMOGLOBIN: 13.6 g/dL (ref 12.0–15.0)
MCH: 29.4 pg (ref 26.0–34.0)
MCHC: 33.7 g/dL (ref 30.0–36.0)
MCV: 87 fL (ref 78.0–100.0)
Platelets: 313 10*3/uL (ref 150–400)
RBC: 4.63 MIL/uL (ref 3.87–5.11)
RDW: 13.4 % (ref 11.5–15.5)
WBC: 8 10*3/uL (ref 4.0–10.5)

## 2017-03-15 LAB — BASIC METABOLIC PANEL
ANION GAP: 8 (ref 5–15)
BUN: <5 mg/dL — ABNORMAL LOW (ref 6–20)
CALCIUM: 8.4 mg/dL — AB (ref 8.9–10.3)
CO2: 22 mmol/L (ref 22–32)
Chloride: 108 mmol/L (ref 101–111)
Creatinine, Ser: 0.7 mg/dL (ref 0.44–1.00)
GFR calc Af Amer: >60 mL/min (ref >60)
GFR calc non Af Amer: >60 mL/min (ref >60)
Glucose, Bld: 125 mg/dL — ABNORMAL HIGH (ref 65–99)
Potassium: 3.2 mmol/L — ABNORMAL LOW (ref 3.5–5.1)
Sodium: 138 mmol/L (ref 135–145)

## 2017-03-15 LAB — HEMOGLOBIN A1C
HEMOGLOBIN A1C: 5.1 % (ref 4.8–5.6)
MEAN PLASMA GLUCOSE: 100 mg/dL

## 2017-03-15 MED ORDER — LORAZEPAM 1 MG PO TABS: 1.0000 mg | ORAL_TABLET | Freq: Four times a day (QID) | ORAL | Status: DC | PRN

## 2017-03-15 MED ORDER — HALOPERIDOL LACTATE 5 MG/ML IJ SOLN
1.0000 mg | Freq: Once | INTRAMUSCULAR | Status: AC
  Administered 2017-03-15: 1 mg via INTRAVENOUS
  Filled 2017-03-15: qty 1

## 2017-03-15 MED ORDER — LORAZEPAM 2 MG/ML IJ SOLN: 1.0000 mg | Freq: Four times a day (QID) | INTRAMUSCULAR | Status: DC | PRN

## 2017-03-15 MED ORDER — DEXTROSE-NACL 5-0.9 % IV SOLN
INTRAVENOUS | Status: DC
  Administered 2017-03-15 – 2017-03-16 (×3): via INTRAVENOUS

## 2017-03-15 MED ORDER — POTASSIUM CHLORIDE 10 MEQ/100ML IV SOLN
10.0000 meq | INTRAVENOUS | Status: AC
Start: 2017-03-15 — End: 2017-03-15
  Administered 2017-03-15: 10 meq via INTRAVENOUS
  Filled 2017-03-15 (×2): qty 100

## 2017-03-15 MED ORDER — AMLODIPINE BESYLATE 5 MG PO TABS
5.0000 mg | ORAL_TABLET | Freq: Every day | ORAL | Status: DC
  Administered 2017-03-16 – 2017-03-18 (×3): 5 mg via ORAL
  Filled 2017-03-15 (×4): qty 1

## 2017-03-15 MED ORDER — POTASSIUM CHLORIDE CRYS ER 20 MEQ PO TBCR: 40.0000 meq | EXTENDED_RELEASE_TABLET | Freq: Once | ORAL | Status: DC

## 2017-03-15 NOTE — Care Management Important Message (Signed)
Important Message  Patient Details  Name: Susan Ryan MRN: [id number] Date of Birth: [birth year]   Medicare Important Message Given:  Yes    Nathen May 03/15/2017, 10:33 AM

## 2017-03-15 NOTE — Progress Notes (Signed)
Pt's been confused and fighting [place] every time we have to do nursing care,have to hold hands to be able to perfom 2 D echo., loss IV access while running 1st bag of potassium, MD was paged and said to stop running the IV fluids and potassium, mittens was removed as ordered. Will continue to monitor.

## 2017-03-15 NOTE — Progress Notes (Signed)
Family Medicine Teaching Service Daily Progress Note Intern Pager: [phone]  Patient name: Susan Ryan Medical record number: [id number] Date of birth: 22-Oct-1940 Age: 76 y.o. Gender: female  Primary Care Provider: Patient, No Pcp Per Consultants: neurology Code Status: full  Pt Overview and Major Events to Date:  7/13: Admitted to FMTS for AMS and mild rhabdo  Assessment and Plan: Susan Ryan is a 76 y.o. female presenting with AMS, having been found confused in her home. PMH is significant for afib.   AMS: possibly due to stroke vs seizure vs dehydration. Patient not moving left arm on exam and unable to tell me if sensation is intact, so need to rule out stroke. Unsure if she is on any medications at home that could be causing this. History of alcohol abuse per family.  Drug/alcohol intoxication ruled out with negative UDS and serum alcohol. CT head negative, ruling out intracranial bleed. Infection less likely due to UA without leukocytes/nitrite, negative CXR, no fevers. Elevated WBC count likely elevated in the setting of dehydration. Cardiac etiology less likely with negative troponins.  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  EEG negative for seizure activity.  Blood culture shows no growth 3 days. Negative urine culture 2 day.  Vitamin B12 level was 454.  CIWA score = 5 overnight.  CTA head and neck from 7/15 show stable athersclerotic disease, stable atrophy, stable infarct.   -Neurology following, appreciate recs -Echo to be done most likely on 7/17 -continue to follow blood and urine cultures -Will monitor for fevers/signs of infection. Hold on antibiotics for now. -Delirium precautions -Neuro checks q2hrs  -CIWA protocol -speech cleared her for dysphagia 1 diet, honey thick liquids, no barium swallow recommended due to patient's AMS   Left arm pain: patient refuses to use left arm and is holding it close to her body. Patient jumps when I attempt to examine  her arm. Could be sore 2/2 to rhabdo. Could also not be using her arm in the setting of stroke. Good radial pulse and no edema, so low suspicion for compartment syndrome.  X-rays of left arm and forearm were negative for fracture on 03/12/17.  Rhabdomyolysis, mild: CK on admission 3142. AST also mildly elevated to 70. UA with hemoglobin but no RBCs.  Repeat CK 1,922 from 03/12/17.  Creatinine continues to resolve 1.31-->1.02-->0.75-->0.70  Dementia: unable to perform ADLs at home.  PT recommended trial of PT for 2 weeks, SNF placement.  Palliative saw family on 7/14, family not interested in palliative care discussion at this time. -Delirium precautions -OT/PT recommend SNF  HTN: BPs as high as 190/89 overnight. BP 166/94. Goal <160/100 in this demented, elderly patient.  Received one dose of hydralazine on 03/14/17 - continue PRN hydralazine 5 mg for SBP >170, DBP >100 - due to continued high BP readings and length of time since stroke, will start on 5 mg Norvasc daily   ?Afib: Concern for A-fib on admission EKG; however, on further evaluation, it appears that she has a regular rate and p waves are noted in most leads. Repeat EKG on 03/12/17 with sinus arrhythmia. Has been in NSR on telemetry overnight. -Monitor on telemetry  Skin breakdown: patient with erythema over genital area, likely due to fecal incontinence as well as sedentary lifestyle.  Wound care saw patient on 03/12/17, recommended cleansing perineum daily, patting dry, and adding Boudreaux butt paste BID.  Hypokalemia:  K+ at 3.2 on 03/15/17 -replaced with 10 mEq x 3 KCl on 7/17 -follow  up with daily BMPs  EKG changes: New inverted T waves in leads V1-V5 on EKG from 03/12/17.  Troponins remained negative on 03/13/17.  EKG on 7/15 showed resolved T-wave inversions in leads V2-V5  FEN/GI: dysphagia 1 diet with honey thick liquids per speech recommendations Prophylaxis: heparin subcutaneous as head CT negative for acute  bleed  Disposition: Keep in stepdown today due to frequent neuro checks. Consider transfer to floor on 03/13/17. Ultimately, anticipate discharge to SNF.  Subjective:  Patient denies pain or discomfort.  She is mumbling, sometimes coherently and sometimes incoherently.  Able to respond appropriately to some but not all of my questions.  Did extend both feet on command today.  Still unable to move left arm.  Objective: Temp:  [97.5 F (36.4 C)-98.3 F (36.8 C)] 97.9 F (36.6 C) (07/17 0834) Pulse Rate:  [50-86] 69 (07/17 0834) Resp:  [14-20] 14 (07/17 0834) BP: (109-190)/(50-104) 166/94 (07/17 0834) SpO2:  [94 %-100 %] 99 % (07/17 0834) Physical Exam: General: cachectic and disheveled female, awake and talking HEENT: [place]/AT, EOMI Neck: supple Cardiovascular: RRR, no MRG Respiratory: CTAB, easy WOB  Gastrointestinal: nontender, +BS MSK: does not move left upper extremity, no tenderness to palpation of the left arm.   Derm: no rashes or lesions on exposed skin, ecchymosis on left cheek Neuro: awake, alert, oriented to self only, no slurred speech, holding left upper extremity against body, can open eyes and extend legs on command Psych: unable to assess  Laboratory:  Recent Labs Lab 03/13/17 0308 03/14/17 0159 03/15/17 0757  WBC 10.5 8.5 8.0  HGB 13.1 13.4 13.6  HCT 40.0 40.5 40.3  PLT 217 305 313    Recent Labs Lab 03/11/17 1722 03/12/17 1037  03/13/17 1750 03/14/17 0159 03/15/17 0757  NA 139 138  < > 135 137 138  K 4.6 3.3*  < > 3.1* 4.0 3.2*  CL 102 111  < > 106 106 108  CO2 23 20*  < > 23 24 22   BUN 26* 17  < > <5* <5* <5*  CREATININE 1.31* 1.02*  < > 0.68 0.75 0.70  CALCIUM 9.0 7.9*  < > 7.9* 8.3* 8.4*  PROT 7.4 5.3*  --   --   --   --   BILITOT 2.0* 1.5*  --   --   --   --   ALKPHOS 68 42  --   --   --   --   ALT 33 27  --   --   --   --   AST 70* 49*  --   --   --   --   GLUCOSE 116* 104*  < > 85 109* 125*  < > = values in this interval not  displayed.   Imaging/Diagnostic Tests: CXR- no acute process CT head- atrophy, no acute intracranial abnormality   No results found.   Kathrene Alu, MD 03/15/2017, 9:18 AM PGY-1, [place] Intern pager: [phone], text pages welcome

## 2017-03-15 NOTE — NC FL2 (Signed)
[place] LEVEL OF CARE SCREENING TOOL     IDENTIFICATION  Patient Name: Susan Ryan Birthdate: [birth year] Sex: female Admission Date (Current Location): 03/11/2017  [facility] and [place] Number:  Herbalist and Address:  [facility] [place], [place] [address], [place], [place] [address]      Provider Number: [id number]  Attending Physician Name and Address:  Leeanne Rio, MD  Relative Name and Phone Number:       Current Level of Care: Hospital Recommended Level of Care: [place] Prior Approval Number:    Date Approved/Denied:   PASRR Number: [id number]  Discharge Plan: SNF    Current Diagnoses: Patient Active Problem List   Diagnosis Date Noted  . Stroke (cerebrum) ([place])   . 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 Palliative care by specialist   . Dehydration   . Non-traumatic rhabdomyolysis   . Weakness   . Altered mental status 03/11/2017    Orientation RESPIRATION BLADDER Height & Weight     Self  Normal Incontinent, External catheter Weight: 99 lb 6.8 oz (45.1 kg) Height:  5\' 2"  (157.5 cm) (per family)  BEHAVIORAL SYMPTOMS/MOOD NEUROLOGICAL BOWEL NUTRITION STATUS      Incontinent Diet (see DC summary)  AMBULATORY STATUS COMMUNICATION OF NEEDS Skin   Extensive Assist Verbally Normal                       Personal Care Assistance Level of Assistance  Bathing, Dressing, Feeding Bathing Assistance: Maximum assistance Feeding assistance: Maximum assistance Dressing Assistance: Maximum assistance     Functional Limitations Info             SPECIAL CARE FACTORS FREQUENCY  PT (By licensed PT), OT (By licensed OT)     PT Frequency: 5/wk OT Frequency: 5/wk            Contractures      Additional Factors Info  Code Status, Allergies Code Status Info: DNR Allergies Info: NKA           Current Medications (03/15/2017):  This is the current hospital active  medication list Current Facility-Administered Medications  Medication Dose Route Frequency Provider Last Rate Last Dose  . amLODipine (NORVASC) tablet 5 mg  5 mg Oral Daily Verner Mould, MD      . aspirin EC tablet 325 mg  325 mg Oral Daily Rosalin Hawking, MD       Or  . aspirin suppository 300 mg  300 mg Rectal Daily Rosalin Hawking, MD   300 mg at 03/15/17 0930  . dextrose 5 %-0.9 % sodium chloride infusion   Intravenous Continuous Kathrene Alu, MD 100 mL/hr at 03/15/17 0659    . heparin injection 5,000 Units  5,000 Units Subcutaneous Q8H Leeanne Rio, MD   5,000 Units at 03/15/17 1318  . hydrALAZINE (APRESOLINE) injection 5 mg  5 mg Intravenous Q4H PRN Caroline More, DO   5 mg at 03/14/17 1148  . liver oil-zinc oxide (DESITIN) 40 % ointment   Topical BID Leeanne Rio, MD      . potassium chloride 10 mEq in 100 mL IVPB  10 mEq Intravenous Q1 Hr x 3 Winfrey, Amanda C, MD 100 mL/hr at 03/15/17 1250 10 mEq at 03/15/17 1250  . RESOURCE THICKENUP CLEAR   Oral PRN Leeanne Rio, MD      . sodium chloride flush (NS) 0.9 % injection 3 mL  3  mL Intravenous Q12H Sela Hilding, MD   3 mL at 03/15/17 1642     Discharge Medications: Please see discharge summary for a list of discharge medications.  Relevant Imaging Results:  Relevant Lab Results:   Additional Information SS#: [id number]  Jorge Ny, LCSW

## 2017-03-15 NOTE — Progress Notes (Signed)
  Echocardiogram 2D Echocardiogram has been performed.  Susan Ryan 03/15/2017, 12:55 PM

## 2017-03-15 NOTE — Clinical Social Work Note (Signed)
Clinical Social Work Assessment  Patient Details  Name: Susan Ryan MRN: [id number] Date of Birth: [birth year]  Date of referral:  03/15/17               Reason for consult:  Facility Placement                Permission sought to share information with:  Chartered certified accountant granted to share information::  Yes, Verbal Permission Granted  Name::     Ship broker::  SNF  Relationship::  friend  Contact Information:     Housing/Transportation Living arrangements for the past 2 months:  [place] of Information:   (friend) Patient Interpreter Needed:  None Criminal Activity/Legal Involvement Pertinent to Current Situation/Hospitalization:  No - Comment as needed Significant Relationships:  Adult Children, Friend Lives with:  Self Do you feel safe going back to the place where you live?  No Need for family participation in patient care:  Yes (Comment) (decision making)  Care giving concerns:  Pt lives at home alone- now with increased impairment and unable to care for self at home.   Social Worker assessment / plan:  CSW spoke with pt friend, Wells Guiles, regarding pt disposition (per MD family requests we speak with rebecca).  Explained PT recommendation for SNF and SNF referral process.  Employment status:  Retired Forensic scientist:  Medicare PT Recommendations:  [place] / Referral to community resources:  [place]  Patient/Family's Response to care:  Family agreeable to SNF placement at this time- hopeful that pt will improve with therapy enough to be safe at home.  Patient/Family's Understanding of and Emotional Response to Diagnosis, Current Treatment, and Prognosis: Pt family emotional about current prognosis and condition but hopeful that pt can improve with rehab stay.  Emotional Assessment Appearance:  Appears stated age Attitude/Demeanor/Rapport:  Unable to Assess Affect (typically  observed):  Unable to Assess Orientation:  Oriented to Self Alcohol / Substance use:  Alcohol Use Psych involvement (Current and /or in the community):  No (Comment)  Discharge Needs  Concerns to be addressed:  Care Coordination Readmission within the last 30 days:  No Current discharge risk:  Physical Impairment Barriers to Discharge:  Continued Medical Work up   Jorge Ny, LCSW 03/15/2017, 4:03 PM

## 2017-03-16 DIAGNOSIS — I638 Other cerebral infarction: Principal | ICD-10-CM

## 2017-03-16 DIAGNOSIS — M79602 Pain in left arm: Secondary | ICD-10-CM

## 2017-03-16 LAB — BASIC METABOLIC PANEL
ANION GAP: 8 (ref 5–15)
BUN: <5 mg/dL — ABNORMAL LOW (ref 6–20)
CHLORIDE: 109 mmol/L (ref 101–111)
CO2: 21 mmol/L — ABNORMAL LOW (ref 22–32)
Calcium: 8.5 mg/dL — ABNORMAL LOW (ref 8.9–10.3)
Creatinine, Ser: 0.85 mg/dL (ref 0.44–1.00)
GFR calc Af Amer: >60 mL/min (ref >60)
GLUCOSE: 114 mg/dL — AB (ref 65–99)
POTASSIUM: 3.3 mmol/L — AB (ref 3.5–5.1)
SODIUM: 138 mmol/L (ref 135–145)

## 2017-03-16 LAB — CBC
HEMATOCRIT: 39.7 % (ref 36.0–46.0)
HEMOGLOBIN: 12.8 g/dL (ref 12.0–15.0)
MCH: 28.4 pg (ref 26.0–34.0)
MCHC: 32.2 g/dL (ref 30.0–36.0)
MCV: 88 fL (ref 78.0–100.0)
Platelets: 332 10*3/uL (ref 150–400)
RBC: 4.51 MIL/uL (ref 3.87–5.11)
RDW: 13.7 % (ref 11.5–15.5)
WBC: 7.3 10*3/uL (ref 4.0–10.5)

## 2017-03-16 LAB — CULTURE, BLOOD (ROUTINE X 2)
CULTURE: NO GROWTH
CULTURE: NO GROWTH
SPECIAL REQUESTS: ADEQUATE
Special Requests: ADEQUATE

## 2017-03-16 MED ORDER — KETOROLAC TROMETHAMINE 15 MG/ML IJ SOLN
15.0000 mg | Freq: Once | INTRAMUSCULAR | Status: AC
  Administered 2017-03-16: 15 mg via INTRAVENOUS
  Filled 2017-03-16: qty 1

## 2017-03-16 MED ORDER — HYDRALAZINE HCL 20 MG/ML IJ SOLN
5.0000 mg | INTRAMUSCULAR | Status: DC | PRN
  Administered 2017-03-16: 5 mg via INTRAVENOUS
  Filled 2017-03-16: qty 1

## 2017-03-16 MED ORDER — HALOPERIDOL LACTATE 5 MG/ML IJ SOLN
1.0000 mg | Freq: Four times a day (QID) | INTRAMUSCULAR | Status: DC | PRN
  Filled 2017-03-16: qty 1

## 2017-03-16 NOTE — Progress Notes (Signed)
Family Medicine Teaching Service Daily Progress Note Intern Pager: [phone]  Patient name: Susan Ryan Medical record number: [id number] Date of birth: 01/20/1941 Age: 76 y.o. Gender: female  Primary Care Provider: Patient, No Pcp Per Consultants: neurology Code Status: full  Pt Overview and Major Events to Date:  7/13: Admitted to FMTS for AMS and mild rhabdo.  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  Echo negative for cardiac source of embolus.  EEG negative for seizure activity.  CTA head and neck negative for additional findings.  Since no further diagnostic workup has been indicated for this patient and PT/OT have assessed and recommended SNF, was started to be prepared for discharge to SNF on 7/17.  Patient has been pulling out her IVs, so she has required mitts.  For her to be discharged, she must be without mitts or other restraints for 24 hours.  However, she has refused to take anything PO, so she requires IV access, and without mitts, she pulls out her IVs.  Her nurse also reported that she has kicked her multiple times.  Therefore, the mitts were put back on, and we will optimize medical management to keep her calm.  Assessment and Plan: Susan Ryan is a 76 y.o. female presenting with AMS, having been found confused in her home. PMH is significant for afib.   AMS: possibly due to stroke vs seizure vs dehydration. Patient not moving left arm on exam and unable to tell me if sensation is intact, so need to rule out stroke. Unsure if she is on any medications at home that could be causing this. History of alcohol abuse per family.  Drug/alcohol intoxication ruled out with negative UDS and serum alcohol. CT head negative, ruling out intracranial bleed. Infection less likely due to UA without leukocytes/nitrite, negative CXR, no fevers. Elevated WBC count likely elevated in the setting of dehydration. Cardiac etiology less likely with negative troponins.  MRI showed right  basal ganglia to internal capsule infarct.  EEG negative for seizure activity.  Blood culture shows no growth 3 days. Negative urine culture 2 day.  Vitamin B12 level was 454.  CIWA score = 5 overnight.  CTA head and neck from 7/15 show stable athersclerotic disease, stable atrophy, stable infarct.  Echo shows no source of emboli. -Neurology following, appreciate recs -Will monitor for fevers/signs of infection. Hold on antibiotics for now. -Delirium precautions -Neuro checks q2hrs  -CIWA protocol -speech cleared her for dysphagia 1 diet, honey thick liquids, no barium swallow recommended due to patient's AMS  -on Ativan 1 mg Q6H for agitation per nurse's request  Left arm pain: patient refuses to use left arm and is holding it close to her body. Patient jumps when I attempt to examine her arm. Could be sore 2/2 to rhabdo. Could also not be using her arm in the setting of stroke. Good radial pulse and no edema, so low suspicion for compartment syndrome.  X-rays of left arm and forearm were negative for fracture on 03/12/17. -Toradol for left shoulder pain 7/18  Rhabdomyolysis, mild: CK on admission 3142. AST also mildly elevated to 70. UA with hemoglobin but no RBCs.  Repeat CK 1,922 from 03/12/17.  Creatinine continues to resolve 1.31-->1.02-->0.75-->0.70  Dementia: unable to perform ADLs at home.  PT recommended trial of PT for 2 weeks, SNF placement.  Palliative saw family on 7/14, family not interested in palliative care discussion at this time.  Patient combative, refusing PO intake, and not tolerating IVs.  -Delirium  precautions -OT/PT recommend SNF  HTN: BPs as high as 190/89 overnight. BP 150/60. Goal <160/100 in this demented, elderly patient.  Received one dose of hydralazine on 03/14/17 - continue PRN hydralazine 5 mg for SBP >170, DBP >100 - due to continued high BP readings and length of time since stroke, will start on 5 mg Norvasc daily (not taking since refusing PO)  ?Afib:  Concern for A-fib on admission EKG; however, on further evaluation, it appears that she has a regular rate and p waves are noted in most leads. Repeat EKG on 03/12/17 with sinus arrhythmia. Has been in NSR on telemetry overnight. -Monitor on telemetry  Skin breakdown: patient with erythema over genital area, likely due to fecal incontinence as well as sedentary lifestyle.  Wound care saw patient on 03/12/17, recommended cleansing perineum daily, patting dry, and adding Boudreaux butt paste BID.  Hypokalemia:  K+ at 3.3 on 03/16/17 -replaced with 10 mEq x 3 KCl on 7/17 -can replace again, but may increase patient's agitation -follow up with daily BMPs  EKG changes: New inverted T waves in leads V1-V5 on EKG from 03/12/17.  Troponins remained negative on 03/13/17.  EKG on 7/15 showed resolved T-wave inversions in leads V2-V5  FEN/GI: dysphagia 1 diet with honey thick liquids per speech recommendations Prophylaxis: heparin subcutaneous as head CT negative for acute bleed  Disposition: Keep in stepdown today due to frequent neuro checks.  Ultimately, anticipate discharge to SNF.  Subjective:  Patient says that she has some pain and indicates that her left shoulder is hurting.  She is mumbling, sometimes coherently and sometimes incoherently.  Able to tell me what her name is and says that she is in a doctor's office.  Able to raise both legs slightly against my hand.  Still unable to move left arm.  Asked me what time it was.  Objective: Temp:  [97.4 F (36.3 C)-97.9 F (36.6 C)] 97.4 F (36.3 C) (07/17 1930) Pulse Rate:  [53-74] 60 (07/18 0600) Resp:  [9-28] 15 (07/18 0600) BP: (96-168)/(58-94) 150/60 (07/18 0600) SpO2:  [97 %-100 %] 100 % (07/18 0600) Physical Exam: General: cachectic and disheveled female, sleeping on arrival and talking HEENT: [place]/AT, EOMI Neck: supple Cardiovascular: RRR, no MRG Respiratory: CTAB, easy WOB  Gastrointestinal: nontender, +BS MSK: does not move left  upper extremity, no tenderness to palpation of the left arm.   Derm: no rashes or lesions on exposed skin, ecchymosis on left cheek Neuro: awake, alert, oriented to self only, no slurred speech, holding left upper extremity against body, can open eyes and raise legs a small amount off of bed, 3/5 strength in legs bilaterally Psych: unable to assess  Laboratory:  Recent Labs Lab 03/14/17 0159 03/15/17 0757 03/16/17 0527  WBC 8.5 8.0 7.3  HGB 13.4 13.6 12.8  HCT 40.5 40.3 39.7  PLT 305 313 332    Recent Labs Lab 03/11/17 1722 03/12/17 1037  03/14/17 0159 03/15/17 0757 03/16/17 0527  NA 139 138  < > 137 138 138  K 4.6 3.3*  < > 4.0 3.2* 3.3*  CL 102 111  < > 106 108 109  CO2 23 20*  < > 24 22 21*  BUN 26* 17  < > <5* <5* <5*  CREATININE 1.31* 1.02*  < > 0.75 0.70 0.85  CALCIUM 9.0 7.9*  < > 8.3* 8.4* 8.5*  PROT 7.4 5.3*  --   --   --   --   BILITOT 2.0* 1.5*  --   --   --   --  ALKPHOS 68 42  --   --   --   --   ALT 33 27  --   --   --   --   AST 70* 49*  --   --   --   --   GLUCOSE 116* 104*  < > 109* 125* 114*  < > = values in this interval not displayed.   Imaging/Diagnostic Tests: CXR- no acute process CT head- atrophy, no acute intracranial abnormality   No results found.   Kathrene Alu, MD 03/16/2017, 7:18 AM PGY-1, [facility] Intern pager: [phone], text pages welcome

## 2017-03-16 NOTE — Progress Notes (Signed)
Physical Therapy Treatment Patient Details Name: Susan Ryan MRN: [id number] DOB: 1941-05-20 Today's Date: 03/16/2017    History of Present Illness 76 yo F with PMhx of dementia and hypertension admitted 03/11/17 with altered mental status and rhabdomyolysis, after being found slumped sideways in a chair by her family. 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     PT Comments    Pt not motivated to get up but not resistant to mobility today as she was last session and did not get agitated, though was beginning to become defiant when trying to get her to attempt standing so session ended. PT will continue to follow.    Follow Up Recommendations  SNF;Supervision/Assistance - 24 hour     Equipment Recommendations  Other (comment) (TBD)    Recommendations for Other Services       Precautions / Restrictions Precautions Precautions: Fall Precaution Comments: patient confused and can be agitated. Restrictions Weight Bearing Restrictions: No    Mobility  Bed Mobility Overal bed mobility: Needs Assistance Bed Mobility: Supine to Sit;Sit to Supine     Supine to sit: Mod assist;+2 for safety/equipment Sit to supine: +2 for safety/equipment;Max assist   General bed mobility comments: encouraged pt to attempt to get to EOB on her own but not motivated enough to move. Once initiated, was able to sit up with mod A +2. Max A +2 to return to supine  Transfers                 General transfer comment: pt would not try despite multiple attempts to facilitate verbally and with tactile cues  Ambulation/Gait                 Stairs            Wheelchair Mobility    Modified Rankin (Stroke Patients Only) Modified Rankin (Stroke Patients Only) Pre-Morbid Rankin Score: No symptoms Modified Rankin: Severe disability     Balance Overall balance assessment: Needs assistance Sitting-balance support: No upper extremity supported;Feet  supported Sitting balance-Leahy Scale: Fair Sitting balance - Comments: pt sat EOB x10 mins with min A most of the time due to posterior lean, a few periods of min-guard Postural control: Posterior lean                                  Cognition Arousal/Alertness: Awake/alert Behavior During Therapy: WFL for tasks assessed/performed Overall Cognitive Status: Impaired/Different from baseline Area of Impairment: Orientation;Attention;Memory;Safety/judgement;Problem solving;Following commands                 Orientation Level: Disoriented to;Place;Time;Situation Current Attention Level: Focused Memory: Decreased short-term memory Following Commands: Follows multi-step commands inconsistently Safety/Judgement: Decreased awareness of safety   Problem Solving: Slow processing;Difficulty sequencing General Comments: pt did not become agitated this session but when pressed to try to stand, she became more defiant so did not push it.       Exercises General Exercises - Lower Extremity Ankle Circles/Pumps: AROM;Both;10 reps;Supine    General Comments General comments (skin integrity, edema, etc.): HR 58 bpm, O2 sats 100% on RA, RR 15 after session      Pertinent Vitals/Pain Pain Assessment: Faces Faces Pain Scale: Hurts a little bit Pain Location: all over with mvmt Pain Descriptors / Indicators: Moaning;Grimacing Pain Intervention(s): Limited activity within patient's tolerance;Monitored during session;Repositioned         HR 58 bpm after  session  Home Living                      Prior Function            PT Goals (current goals can now be found in the care plan section) Acute Rehab PT Goals Patient Stated Goal: Unable to state PT Goal Formulation: Patient unable to participate in goal setting Time For Goal Achievement: 03/26/17 Potential to Achieve Goals: Poor Progress towards PT goals: Progressing toward goals    Frequency    Min  2X/week      PT Plan Current plan remains appropriate    Co-evaluation              AM-PAC PT "6 Clicks" Daily Activity  Outcome Measure  Difficulty turning over in bed (including adjusting bedclothes, sheets and blankets)?: Total Difficulty moving from lying on back to sitting on the side of the bed? : Total Difficulty sitting down on and standing up from a chair with arms (e.g., wheelchair, bedside commode, etc,.)?: Total Help needed moving to and from a bed to chair (including a wheelchair)?: Total Help needed walking in hospital room?: Total Help needed climbing 3-5 steps with a railing? : Total 6 Click Score: 6    End of Session   Activity Tolerance: Other (comment) (limited by pt cognitive status) Patient left: in bed;with bed alarm set;with call bell/phone within reach Nurse Communication: Mobility status PT Visit Diagnosis: Other abnormalities of gait and mobility (R26.89);Muscle weakness (generalized) (M62.81);Other symptoms and signs involving the nervous system (R29.898);Pain     Time: 4259-5638 PT Time Calculation (min) (ACUTE ONLY): 15 min  Charges:  $Therapeutic Activity: 8-22 mins                    G Codes:       Leighton Roach, PT  Acute Rehab Services  [place] 03/16/2017, 10:20 AM

## 2017-03-16 NOTE — Plan of Care (Signed)
Problem: Skin Integrity: Goal: Risk for impaired skin integrity will decrease Outcome: Progressing Discussed with patient about applying cream to MASD perineal and buttock area with no teach back displayed at this time.

## 2017-03-16 NOTE — Plan of Care (Signed)
Problem: Nutrition: Goal: Risk of aspiration will decrease Outcome: Progressing Discussed with patient about the importance of eating with no teach back displayed at this time; will continue to try and encourage her to eat and offer snacks.

## 2017-03-17 DIAGNOSIS — I639 Cerebral infarction, unspecified: Secondary | ICD-10-CM

## 2017-03-17 DIAGNOSIS — R404 Transient alteration of awareness: Secondary | ICD-10-CM

## 2017-03-17 DIAGNOSIS — R5383 Other fatigue: Secondary | ICD-10-CM

## 2017-03-17 DIAGNOSIS — R5381 Other malaise: Secondary | ICD-10-CM

## 2017-03-17 LAB — BASIC METABOLIC PANEL
ANION GAP: 10 (ref 5–15)
BUN: <5 mg/dL — ABNORMAL LOW (ref 6–20)
CALCIUM: 8.9 mg/dL (ref 8.9–10.3)
CO2: 24 mmol/L (ref 22–32)
Chloride: 106 mmol/L (ref 101–111)
Creatinine, Ser: 0.79 mg/dL (ref 0.44–1.00)
GLUCOSE: 102 mg/dL — AB (ref 65–99)
POTASSIUM: 2.9 mmol/L — AB (ref 3.5–5.1)
SODIUM: 140 mmol/L (ref 135–145)

## 2017-03-17 LAB — CBC
HEMATOCRIT: 43.9 % (ref 36.0–46.0)
HEMOGLOBIN: 14.3 g/dL (ref 12.0–15.0)
MCH: 28.7 pg (ref 26.0–34.0)
MCHC: 32.6 g/dL (ref 30.0–36.0)
MCV: 88.2 fL (ref 78.0–100.0)
Platelets: 396 10*3/uL (ref 150–400)
RBC: 4.98 MIL/uL (ref 3.87–5.11)
RDW: 13.6 % (ref 11.5–15.5)
WBC: 9.7 10*3/uL (ref 4.0–10.5)

## 2017-03-17 MED ORDER — POTASSIUM CHLORIDE CRYS ER 20 MEQ PO TBCR
40.0000 meq | EXTENDED_RELEASE_TABLET | Freq: Once | ORAL | Status: AC
  Administered 2017-03-17: 40 meq via ORAL
  Filled 2017-03-17: qty 2

## 2017-03-17 MED ORDER — HALOPERIDOL LACTATE 5 MG/ML IJ SOLN: 1.0000 mg | Freq: Four times a day (QID) | INTRAMUSCULAR | Status: DC | PRN

## 2017-03-17 NOTE — Progress Notes (Signed)
Family Medicine Teaching Service Daily Progress Note Intern Pager: [phone]  Patient name: Susan Ryan Medical record number: [id number] Date of birth: [birth year] Age: 76 y.o. Gender: female  Primary Care Provider: Patient, No Pcp Per Consultants: neurology Code Status: full  Pt Overview and Major Events to Date:  7/13: Admitted to FMTS for AMS and mild rhabdo.  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  Echo negative for cardiac source of embolus.  EEG negative for seizure activity.  CTA head and neck negative for additional findings.  Since no further diagnostic workup has been indicated for this patient and PT/OT have assessed and recommended SNF, was started to be prepared for discharge to SNF on 7/17.  Patient has been pulling out her IVs, so she has required mitts.  For her to be discharged, she must be without mitts or other restraints for 24 hours.  However, she has refused to take anything PO, so she requires IV access, and without mitts, she pulls out her IVs.  Her nurse also reported that she has kicked her multiple times.  Therefore, the mitts were put back on, and we will optimize medical management to keep her calm.  On 03/17/17, the mitts were removed after patient took [place], so we discontinued her IVs, and we will see how she does without restraints.  Assessment and Plan: Susan Ryan is a 76 y.o. female presenting with AMS, having been found confused in her home. PMH is significant for afib.    AMS: possibly due to stroke vs seizure vs dehydration. Patient not moving left arm on exam and unable to tell me if sensation is intact, so need to rule out stroke. Unsure if she is on any medications at home that could be causing this. History of alcohol abuse per family.  Drug/alcohol intoxication ruled out with negative UDS and serum alcohol. CT head negative, ruling out intracranial bleed. Infection less likely due to UA without leukocytes/nitrite, negative CXR, no  fevers. Elevated WBC count likely elevated in the setting of dehydration. Cardiac etiology less likely with negative troponins.  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  EEG negative for seizure activity.  Blood culture shows no growth 3 days. Negative urine culture 2 day.  Vitamin B12 level was 454.  CIWA score = 5 overnight.  CTA head and neck from 7/15 show stable athersclerotic disease, stable atrophy, stable infarct.  Echo shows no source of emboli.  Did not require haloperidol on 7/18.  Did take amlodipine and ASA PO on 7/18, but no PO food or water. -Neurology following, appreciate recs -Will monitor for fevers/signs of infection. Continue to hold antibiotics. -Delirium precautions -Neuro checks q2hrs  -CIWA protocol -dysphagia 1 diet (patient refusing PO food and water) -notified social work of hopefull discharge on 7/20   Left arm pain: patient refuses to use left arm and is holding it close to her body. Patient jumps when I attempt to examine her arm. Could be sore 2/2 to rhabdo. Could also not be using her arm in the setting of stroke. Good radial pulse and no edema, so low suspicion for compartment syndrome.  X-rays of left arm and forearm were negative for fracture on 03/12/17.  Received toradol for reported pain on 7/18.   Rhabdomyolysis, mild: CK on admission 3142. AST also mildly elevated to 70. UA with hemoglobin but no RBCs.  Repeat CK 1,922 from 03/12/17.  Creatinine 0.79 on 7/19   Dementia: unable to perform ADLs at  home.  PT recommended trial of PT for 2 weeks, SNF placement.  Palliative saw family on 7/14, family not interested in palliative care discussion at this time.  Patient often combative, refusing PO intake, and not tolerating IVs.  -Delirium precautions -OT/PT recommend SNF   HTN: BPs as high as 182/81 overnight. BP 168/73. - continue PRN hydralazine 5 mg for SBP >170, DBP >100 - due to continued high BP readings and length of time since  stroke, will start on 5 mg Norvasc daily    ?Afib: Concern for A-fib on admission EKG; however, on further evaluation, it appears that she has a regular rate and p waves are noted in most leads. Repeat EKG on 03/12/17 with sinus arrhythmia. Has been in NSR on telemetry overnight. -Monitor on telemetry   Skin breakdown: patient with erythema over genital area, likely due to fecal incontinence as well as sedentary lifestyle.  Wound care saw patient on 03/12/17, recommended cleansing perineum daily, patting dry, and adding Boudreaux butt paste BID.   Hypokalemia:  K+ at 2.9 on 03/17/17 -replaced with 40 mEq Kdur -follow up with daily BMPs   EKG changes: New inverted T waves in leads V1-V5 on EKG from 03/12/17.  Troponins remained negative on 03/13/17.  EKG on 7/15 showed resolved T-wave inversions in leads V2-V5   FEN/GI: dysphagia 1 diet with honey thick liquids per speech recommendations Prophylaxis: heparin subcutaneous as head CT negative for acute bleed  Disposition: Keep in stepdown today due to frequent neuro checks.  Ultimately, anticipate discharge to SNF.  Subjective:  Patient is mumbling but responds appropriately when asked if she would be willing to take her medications and eat today.  She appears comfortable and is lying in bed.  She swallowed some apple sauce this morning and tolerated this well.  Objective: Temp:  [97.1 F (36.2 C)-98.5 F (36.9 C)] 97.9 F (36.6 C) (07/19 0400) Pulse Rate:  [58-94] 71 (07/19 0600) Resp:  [13-16] 14 (07/19 0600) BP: (123-182)/(58-101) 168/73 (07/19 0600) SpO2:  [99 %-100 %] 100 % (07/19 0600) Physical Exam: General: cachectic and disheveled female, interactive but mumbling, appears euvolemic Cardiovascular: RRR, no MRG Respiratory: CTAB, easy WOB  Gastrointestinal: nontender, +BS MSK: does not move left upper extremity, no tenderness to palpation of the left arm.   Derm: no rashes or lesions on exposed skin, ecchymosis on left  cheek Neuro: awake, alert, mumbling in response to orientation question, holding left upper extremity against body Psych: unable to assess  Laboratory:  Recent Labs Lab 03/15/17 0757 03/16/17 0527 03/17/17 0438  WBC 8.0 7.3 9.7  HGB 13.6 12.8 14.3  HCT 40.3 39.7 43.9  PLT 313 332 396    Recent Labs Lab 03/11/17 1722 03/12/17 1037  03/15/17 0757 03/16/17 0527 03/17/17 0438  NA 139 138  < > 138 138 140  K 4.6 3.3*  < > 3.2* 3.3* 2.9*  CL 102 111  < > 108 109 106  CO2 23 20*  < > 22 21* 24  BUN 26* 17  < > <5* <5* <5*  CREATININE 1.31* 1.02*  < > 0.70 0.85 0.79  CALCIUM 9.0 7.9*  < > 8.4* 8.5* 8.9  PROT 7.4 5.3*  --   --   --   --   BILITOT 2.0* 1.5*  --   --   --   --   ALKPHOS 68 42  --   --   --   --   ALT 33 27  --   --   --   --  AST 70* 49*  --   --   --   --   GLUCOSE 116* 104*  < > 125* 114* 102*  < > = values in this interval not displayed.   Imaging/Diagnostic Tests: CXR- no acute process CT head- atrophy, no acute intracranial abnormality   No results found.   Kathrene Alu, MD 03/17/2017, 7:14 AM PGY-1, [place] Intern pager: [phone], text pages welcome

## 2017-03-17 NOTE — Progress Notes (Signed)
  Speech Language Pathology Treatment: Dysphagia  Patient Details Name: Susan Ryan MRN: [id number] DOB: 16-Nov-1940 Today's Date: 03/17/2017 Time: 1100-1120 SLP Time Calculation (min) (ACUTE ONLY): 20 min  Assessment / Plan / Recommendation Clinical Impression  Mentation continues to be a major barrier to appropriate assessment and any PO intake. Pt resistant to any interventions, hitting and swatting therapist when POs or any assistance with feeding is offered. Pt cannot sip from a lidded cup because she does not sense the lid when she takes sips from the edge and she slings un-lidded cups around in the bed a spills them. SLP was able to observe one sip of coke from a can with no coughing, but given intermittent hard coughing in prior session with thin and nectar thick liquids, could not recommend upgrade with only minimal observation. Cannot attempt MBS due to mentation. Recommend pt continue dys 1/honey thick for now.   HPI        SLP Plan  Continue with current plan of care       Recommendations  Diet recommendations: Dysphagia 1 (puree);Honey-thick liquid Liquids provided via: Cup;Straw;Teaspoon Medication Administration: Crushed with puree Supervision: Staff to assist with self feeding;Full supervision/cueing for compensatory strategies Compensations: Slow rate;Small sips/bites Postural Changes and/or Swallow Maneuvers: Seated upright 90 degrees;Upright 30-60 min after meal                Oral Care Recommendations: Oral care BID Follow up Recommendations: Skilled Nursing facility SLP Visit Diagnosis: Dysphagia, unspecified (R13.10) Plan: Continue with current plan of care       GO               [facility], [place] CCC-SLP [phone]  [name] 03/17/2017, 1:43 PM

## 2017-03-17 NOTE — Progress Notes (Addendum)
Provided bed offers to [facility] for review- she will discuss with pt son and call CSW with choice  Per MD plan for DC to SNF tomorrow if able to stay off restraints  Explained to Wells Guiles that if pt cannot participate with rehab at SNF that she will likely be discharged after a week or two- Wells Guiles to discuss plan with pt son (applying for Medicaid for LTC, paying privately for LTC, or taking pt home at the end of rehab stay)  CSW will continue to follow  Jorge Ny, [place] Social Worker [phone]

## 2017-03-18 LAB — BASIC METABOLIC PANEL
ANION GAP: 12 (ref 5–15)
BUN: 5 mg/dL — ABNORMAL LOW (ref 6–20)
CALCIUM: 9 mg/dL (ref 8.9–10.3)
CO2: 22 mmol/L (ref 22–32)
Chloride: 106 mmol/L (ref 101–111)
Creatinine, Ser: 0.81 mg/dL (ref 0.44–1.00)
Glucose, Bld: 96 mg/dL (ref 65–99)
POTASSIUM: 3.2 mmol/L — AB (ref 3.5–5.1)
SODIUM: 140 mmol/L (ref 135–145)

## 2017-03-18 LAB — VITAMIN B1

## 2017-03-18 MED ORDER — ASPIRIN 325 MG PO TBEC: 325.0000 mg | DELAYED_RELEASE_TABLET | Freq: Every day | ORAL | 0 refills | Status: AC

## 2017-03-18 MED ORDER — ZINC OXIDE 40 % EX OINT: TOPICAL_OINTMENT | Freq: Two times a day (BID) | CUTANEOUS | 0 refills | Status: AC

## 2017-03-18 MED ORDER — POTASSIUM CHLORIDE CRYS ER 20 MEQ PO TBCR
40.0000 meq | EXTENDED_RELEASE_TABLET | Freq: Once | ORAL | Status: AC
  Administered 2017-03-18: 40 meq via ORAL
  Filled 2017-03-18: qty 2

## 2017-03-18 MED ORDER — AMLODIPINE BESYLATE 5 MG PO TABS: 5.0000 mg | ORAL_TABLET | Freq: Every day | ORAL | 0 refills | Status: AC

## 2017-03-18 NOTE — Plan of Care (Signed)
Problem: Education: Goal: Knowledge of [facility] Education information/materials will improve Outcome: Progressing POC reviewed with pt.- pt. confused and unsure how much pt. understands.   

## 2017-03-18 NOTE — Social Work (Signed)
CSW spoke with family and they have selected [facility] for short term rehab.    CSW called Levada Dy from admissions and she confirmed placement. CSW will f/u for DC patient to Facility.  Elissa Hefty, LCSW Clinical Social Worker [phone]

## 2017-03-18 NOTE — Care Management Important Message (Signed)
Important Message  Patient Details  Name: Susan Ryan MRN: [id number] Date of Birth: [birth year]   Medicare Important Message Given:  Yes    Nathen May 03/18/2017, 10:23 AM

## 2017-03-18 NOTE — Progress Notes (Signed)
Physical Therapy Treatment Patient Details Name: Susan Ryan MRN: [id number] DOB: 05/06/1941 Today's Date: 03/18/2017    History of Present Illness 76 yo F with PMhx of dementia and hypertension admitted 03/11/17 with altered mental status and rhabdomyolysis, after being found slumped sideways in a chair by her family. 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     PT Comments    Pt more cooperative but unable to achieve full standing.   Follow Up Recommendations  SNF;Supervision/Assistance - 24 hour     Equipment Recommendations  Other (comment) (TBD)    Recommendations for Other Services       Precautions / Restrictions Precautions Precautions: Fall Restrictions Weight Bearing Restrictions: No    Mobility  Bed Mobility Overal bed mobility: Needs Assistance Bed Mobility: Supine to Sit;Sit to Supine     Supine to sit: +2 for safety/equipment;Max assist Sit to supine: +2 for safety/equipment;Max assist   General bed mobility comments: Assist to bring legs off bed, elevate trunk into sitting, and bring hips to EOB.  Transfers Overall transfer level: Needs assistance   Transfers: Sit to/from Stand Sit to Stand: +2 physical assistance;Max assist         General transfer comment: Heavy assist to bring hips and trunk up from bed. Pt unable to achieve full standing.  Ambulation/Gait                 Stairs            Wheelchair Mobility    Modified Rankin (Stroke Patients Only) Modified Rankin (Stroke Patients Only) Pre-Morbid Rankin Score: No symptoms Modified Rankin: Severe disability     Balance Overall balance assessment: Needs assistance Sitting-balance support: No upper extremity supported;Feet supported Sitting balance-Leahy Scale: Fair Sitting balance - Comments: Sat EOB x 7-8 minutes with min guard assist for safety                                    Cognition Arousal/Alertness:  Awake/alert Behavior During Therapy: WFL for tasks assessed/performed Overall Cognitive Status: Impaired/Different from baseline Area of Impairment: Orientation;Attention;Memory;Safety/judgement;Problem solving;Following commands                 Orientation Level: Disoriented to;Place;Time;Situation Current Attention Level: Focused Memory: Decreased short-term memory Following Commands: Follows multi-step commands inconsistently Safety/Judgement: Decreased awareness of safety   Problem Solving: Slow processing;Difficulty sequencing        Exercises      General Comments        Pertinent Vitals/Pain Pain Assessment: Faces Faces Pain Scale: No hurt    Home Living                      Prior Function            PT Goals (current goals can now be found in the care plan section) Progress towards PT goals: Progressing toward goals (very slow due to cognition)    Frequency    Min 2X/week      PT Plan Current plan remains appropriate    Co-evaluation              AM-PAC PT "6 Clicks" Daily Activity  Outcome Measure  Difficulty turning over in bed (including adjusting bedclothes, sheets and blankets)?: Total Difficulty moving from lying on back to sitting on the side of the bed? : Total Difficulty sitting down on and standing  up from a chair with arms (e.g., wheelchair, bedside commode, etc,.)?: Total Help needed moving to and from a bed to chair (including a wheelchair)?: Total Help needed walking in hospital room?: Total Help needed climbing 3-5 steps with a railing? : Total 6 Click Score: 6    End of Session Equipment Utilized During Treatment: Gait belt Activity Tolerance: Other (comment) (limited by pt cognitive status) Patient left: in bed;with bed alarm set;with call bell/phone within reach Nurse Communication: Mobility status PT Visit Diagnosis: Other abnormalities of gait and mobility (R26.89);Muscle weakness (generalized)  (M62.81);Other symptoms and signs involving the nervous system (R29.898);Pain     Time: 4166-0630 PT Time Calculation (min) (ACUTE ONLY): 15 min  Charges:  $Therapeutic Activity: 8-22 mins                    G Codes:       [facility] PT [place] 03/18/2017, 1:52 PM

## 2017-03-18 NOTE — Social Work (Signed)
Clinical Social Worker facilitated patient discharge including contacting patient family and facility to confirm patient discharge plans.  Clinical information faxed to facility and family agreeable with plan.   CSW arranged ambulance transport via [place] to [phone].    RN to call [phone] to give report prior to discharge.  Clinical Social Worker will sign off for now as social work intervention is no longer needed. Please consult [place] again if new need arises.  Elissa Hefty, LCSW Clinical Social Worker [phone]

## 2017-03-18 NOTE — Clinical Social Work Placement (Signed)
   CLINICAL SOCIAL WORK PLACEMENT  NOTE  Date:  03/18/2017  Patient Details  Name: Susan Ryan MRN: [id number] Date of Birth: 08-12-1941  Clinical Social Work is seeking post-discharge placement for this patient at the [place] level of care (*CSW will initial, date and re-position this form in  chart as items are completed):  Yes   Patient/family provided with [place] Work Department's list of facilities offering this level of care within the geographic area requested by the patient (or if unable, by the patient's family).  Yes   Patient/family informed of their freedom to choose among providers that offer the needed level of care, that participate in Medicare, Medicaid or managed care program needed by the patient, have an available bed and are willing to accept the patient.  Yes   Patient/family informed of [facility]'s ownership interest in [facility] and [facility], as well as of the fact that they are under no obligation to receive care at these facilities.  PASRR submitted to EDS on       PASRR number received on       Existing PASRR number confirmed on       FL2 transmitted to all facilities in geographic area requested by pt/family on       FL2 transmitted to all facilities within larger geographic area on       Patient informed that his/her managed care company has contracts with or will negotiate with certain facilities, including the following:        Yes   Patient/family informed of bed offers received.  Patient chooses bed at [facility] and [place] recommends and patient chooses bed at      Patient to be transferred to [facility] on 03/18/17.  Patient to be transferred to facility by [facility]     Patient family notified on 03/18/17 of transfer.  Name of family member notified:  Wells Guiles     PHYSICIAN Please prepare priority discharge summary, including medications, Please prepare  prescriptions, Please sign FL2, Please sign DNR     Additional Comment:    _______________________________________________ Normajean Baxter, LCSW 03/18/2017, 2:30 PM

## 2017-03-18 NOTE — Progress Notes (Signed)
  Report called to [place] at [facility]. Patient to transfer there later today.

## 2017-03-18 NOTE — Progress Notes (Signed)
Family Medicine Teaching Service Daily Progress Note Intern Pager: [phone]  Patient name: Susan Ryan Medical record number: [id number] Date of birth: June 14, 1941 Age: 76 y.o. Gender: female  Primary Care Provider: Patient, No Pcp Per Consultants: neurology Code Status: full  Pt Overview and Major Events to Date:  7/13: Admitted to [facility] for AMS and mild rhabdo.  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  Echo negative for cardiac source of embolus.  EEG negative for seizure activity.  CTA head and neck negative for additional findings.  Since no further diagnostic workup has been indicated for this patient and PT/OT have assessed and recommended SNF, was started to be prepared for discharge to SNF on 7/17.  Patient has been pulling out her IVs, so she has required mitts.  For her to be discharged, she must be without mitts or other restraints for 24 hours.  However, she has refused to take anything PO, so she requires IV access, and without mitts, she pulls out her IVs.  Her nurse also reported that she has kicked her multiple times.  Therefore, the mitts were put back on, and we will optimize medical management to keep her calm.  On 03/17/17, the mitts were removed after patient took [place], so we discontinued her IVs, and we will see how she does without restraints.  No haloperidol required over last 24 hours.  Assessment and Plan: Susan Ryan is a 76 y.o. female presenting with AMS, having been found confused in her home. PMH is significant for afib.    AMS: possibly due to stroke vs seizure vs dehydration. Patient not moving left arm on exam and unable to tell me if sensation is intact, so need to rule out stroke. Unsure if she is on any medications at home that could be causing this. History of alcohol abuse per family.  Drug/alcohol intoxication ruled out with negative UDS and serum alcohol. CT head negative, ruling out intracranial bleed. Infection less likely due to UA  without leukocytes/nitrite, negative CXR, no fevers. Elevated WBC count likely elevated in the setting of dehydration. Cardiac etiology less likely with negative troponins.  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  EEG negative for seizure activity.  Blood culture shows no growth 3 days. Negative urine culture 2 day.  Vitamin B12 level was 454.  CIWA score = 5 overnight.  CTA head and neck from 7/15 show stable athersclerotic disease, stable atrophy, stable infarct.  Echo shows no source of emboli.  Did not require haloperidol on 7/18.  Did take amlodipine and ASA PO on 7/18, but no PO food or water. -Delirium precautions -Neuro checks q2hrs  -CIWA protocol -dysphagia 1 diet (patient refusing PO food and water)   Left arm pain: patient refuses to use left arm and is holding it close to her body. Patient jumps when I attempt to examine her arm. Could be sore 2/2 to rhabdo. Could also not be using her arm in the setting of stroke. Good radial pulse and no edema, so low suspicion for compartment syndrome.  X-rays of left arm and forearm were negative for fracture on 03/12/17.  Received toradol for reported pain on 7/18.   Rhabdomyolysis, mild: CK on admission 3142. AST also mildly elevated to 70. UA with hemoglobin but no RBCs.  Repeat CK 1,922 from 03/12/17.  Creatinine 0.79 on 7/19   Dementia: unable to perform ADLs at home.  PT recommended trial of PT for 2 weeks, SNF placement.  Palliative  saw family on 7/14, family not interested in palliative care discussion at this time.  Patient often combative, refusing PO intake, and not tolerating IVs.  -Delirium precautions -OT/PT recommend SNF   HTN: BPs as high as 163/85 overnight, which is lower than her pressures have been since admission.  Current BP 135/74. - continue PRN hydralazine 5 mg for SBP >170, DBP >100 - due to continued high BP readings and length of time since stroke, will start on 5 mg Norvasc daily    ?Afib:  Concern for A-fib on admission EKG; however, on further evaluation, it appears that she has a regular rate and p waves are noted in most leads. Repeat EKG on 03/12/17 with sinus arrhythmia. Has been in NSR on telemetry overnight. -Monitor on telemetry   Skin breakdown: patient with erythema over genital area, likely due to fecal incontinence as well as sedentary lifestyle.  Wound care saw patient on 03/12/17, recommended cleansing perineum daily, patting dry, and adding Boudreaux butt paste BID.   Hypokalemia:  K+ at 3.2 on 03/18/17 -replaced with 40 mEq Kdur -follow up with daily BMPs   EKG changes: New inverted T waves in leads V1-V5 on EKG from 03/12/17.  Troponins remained negative on 03/13/17.  EKG on 7/15 showed resolved T-wave inversions in leads V2-V5   FEN/GI: dysphagia 1 diet with honey thick liquids per speech recommendations Prophylaxis: heparin subcutaneous as head CT negative for acute bleed  Disposition: Keep in stepdown today due to frequent neuro checks.  Ultimately, anticipate discharge to SNF.  Subjective:  Patient is mumbling and is not very conversant today.  Could not answer any questions today.  Objective: Temp:  [97.7 F (36.5 C)-98.2 F (36.8 C)] 98 F (36.7 C) (07/20 0724) Pulse Rate:  [55-115] 55 (07/20 0300) Resp:  [11-17] 15 (07/20 0300) BP: (129-163)/(57-85) 135/74 (07/20 0724) SpO2:  [91 %-100 %] 100 % (07/20 0038) Weight:  [105 lb 2.6 oz (47.7 kg)] 105 lb 2.6 oz (47.7 kg) (07/20 0300) Physical Exam: General: cachectic and disheveled female, interactive but mumbling, appears comfortable and euvolemic Cardiovascular: RRR, no MRG Respiratory: CTAB, easy WOB  Gastrointestinal: nontender, +BS MSK: does not move left upper extremity, no tenderness to palpation of the left arm.   Derm: no rashes or lesions on exposed skin, ecchymosis on left cheek Neuro: awake, alert, not oriented, responsive but not making sense Psych: unable to  assess  Laboratory:  Recent Labs Lab 03/15/17 0757 03/16/17 0527 03/17/17 0438  WBC 8.0 7.3 9.7  HGB 13.6 12.8 14.3  HCT 40.3 39.7 43.9  PLT 313 332 396    Recent Labs Lab 03/11/17 1722 03/12/17 1037  03/16/17 0527 03/17/17 0438 03/18/17 0753  NA 139 138  < > 138 140 140  K 4.6 3.3*  < > 3.3* 2.9* 3.2*  CL 102 111  < > 109 106 106  CO2 23 20*  < > 21* 24 22  BUN 26* 17  < > <5* <5* 5*  CREATININE 1.31* 1.02*  < > 0.85 0.79 0.81  CALCIUM 9.0 7.9*  < > 8.5* 8.9 9.0  PROT 7.4 5.3*  --   --   --   --   BILITOT 2.0* 1.5*  --   --   --   --   ALKPHOS 68 42  --   --   --   --   ALT 33 27  --   --   --   --   AST 70* 49*  --   --   --   --  GLUCOSE 116* 104*  < > 114* 102* 96  < > = values in this interval not displayed.   Imaging/Diagnostic Tests: CXR- no acute process CT head- atrophy, no acute intracranial abnormality   No results found.   Kathrene Alu, MD 03/18/2017, 7:27 AM PGY-1, [place] Intern pager: [phone], text pages welcome

## 2017-03-21 DIAGNOSIS — I639 Cerebral infarction, unspecified: Secondary | ICD-10-CM | POA: Diagnosis not present

## 2017-03-21 DIAGNOSIS — R262 Difficulty in walking, not elsewhere classified: Secondary | ICD-10-CM | POA: Diagnosis not present

## 2017-03-21 DIAGNOSIS — I119 Hypertensive heart disease without heart failure: Secondary | ICD-10-CM | POA: Diagnosis not present

## 2017-03-21 DIAGNOSIS — F0390 Unspecified dementia without behavioral disturbance: Secondary | ICD-10-CM | POA: Diagnosis not present

## 2017-03-25 DIAGNOSIS — F0391 Unspecified dementia with behavioral disturbance: Secondary | ICD-10-CM | POA: Diagnosis not present

## 2017-03-25 DIAGNOSIS — Z658 Other specified problems related to psychosocial circumstances: Secondary | ICD-10-CM | POA: Diagnosis not present

## 2017-04-04 DIAGNOSIS — Z8679 Personal history of other diseases of the circulatory system: Secondary | ICD-10-CM | POA: Diagnosis not present

## 2017-04-04 DIAGNOSIS — F0150 Vascular dementia without behavioral disturbance: Secondary | ICD-10-CM | POA: Diagnosis not present

## 2017-04-04 DIAGNOSIS — F0390 Unspecified dementia without behavioral disturbance: Secondary | ICD-10-CM | POA: Diagnosis not present

## 2017-04-04 DIAGNOSIS — R4182 Altered mental status, unspecified: Secondary | ICD-10-CM | POA: Diagnosis not present

## 2017-04-04 DIAGNOSIS — Z8739 Personal history of other diseases of the musculoskeletal system and connective tissue: Secondary | ICD-10-CM | POA: Diagnosis not present

## 2017-04-04 DIAGNOSIS — I639 Cerebral infarction, unspecified: Secondary | ICD-10-CM | POA: Diagnosis not present

## 2017-04-05 DIAGNOSIS — Z8679 Personal history of other diseases of the circulatory system: Secondary | ICD-10-CM | POA: Diagnosis not present

## 2017-04-05 DIAGNOSIS — Z8739 Personal history of other diseases of the musculoskeletal system and connective tissue: Secondary | ICD-10-CM | POA: Diagnosis not present

## 2017-04-05 DIAGNOSIS — F0150 Vascular dementia without behavioral disturbance: Secondary | ICD-10-CM | POA: Diagnosis not present

## 2017-04-05 DIAGNOSIS — I639 Cerebral infarction, unspecified: Secondary | ICD-10-CM | POA: Diagnosis not present

## 2017-04-06 DIAGNOSIS — Z8679 Personal history of other diseases of the circulatory system: Secondary | ICD-10-CM | POA: Diagnosis not present

## 2017-04-06 DIAGNOSIS — F0150 Vascular dementia without behavioral disturbance: Secondary | ICD-10-CM | POA: Diagnosis not present

## 2017-04-06 DIAGNOSIS — I639 Cerebral infarction, unspecified: Secondary | ICD-10-CM | POA: Diagnosis not present

## 2017-04-06 DIAGNOSIS — Z8739 Personal history of other diseases of the musculoskeletal system and connective tissue: Secondary | ICD-10-CM | POA: Diagnosis not present

## 2017-04-07 DIAGNOSIS — Z8679 Personal history of other diseases of the circulatory system: Secondary | ICD-10-CM | POA: Diagnosis not present

## 2017-04-07 DIAGNOSIS — F0150 Vascular dementia without behavioral disturbance: Secondary | ICD-10-CM | POA: Diagnosis not present

## 2017-04-07 DIAGNOSIS — I639 Cerebral infarction, unspecified: Secondary | ICD-10-CM | POA: Diagnosis not present

## 2017-04-07 DIAGNOSIS — Z8739 Personal history of other diseases of the musculoskeletal system and connective tissue: Secondary | ICD-10-CM | POA: Diagnosis not present

## 2017-04-08 DIAGNOSIS — Z8679 Personal history of other diseases of the circulatory system: Secondary | ICD-10-CM | POA: Diagnosis not present

## 2017-04-08 DIAGNOSIS — Z8739 Personal history of other diseases of the musculoskeletal system and connective tissue: Secondary | ICD-10-CM | POA: Diagnosis not present

## 2017-04-08 DIAGNOSIS — I639 Cerebral infarction, unspecified: Secondary | ICD-10-CM | POA: Diagnosis not present

## 2017-04-08 DIAGNOSIS — F0150 Vascular dementia without behavioral disturbance: Secondary | ICD-10-CM | POA: Diagnosis not present

## 2017-04-09 DIAGNOSIS — Z8679 Personal history of other diseases of the circulatory system: Secondary | ICD-10-CM | POA: Diagnosis not present

## 2017-04-09 DIAGNOSIS — F0150 Vascular dementia without behavioral disturbance: Secondary | ICD-10-CM | POA: Diagnosis not present

## 2017-04-09 DIAGNOSIS — Z8739 Personal history of other diseases of the musculoskeletal system and connective tissue: Secondary | ICD-10-CM | POA: Diagnosis not present

## 2017-04-09 DIAGNOSIS — I639 Cerebral infarction, unspecified: Secondary | ICD-10-CM | POA: Diagnosis not present

## 2017-04-10 DIAGNOSIS — I639 Cerebral infarction, unspecified: Secondary | ICD-10-CM | POA: Diagnosis not present

## 2017-04-10 DIAGNOSIS — Z8739 Personal history of other diseases of the musculoskeletal system and connective tissue: Secondary | ICD-10-CM | POA: Diagnosis not present

## 2017-04-10 DIAGNOSIS — Z8679 Personal history of other diseases of the circulatory system: Secondary | ICD-10-CM | POA: Diagnosis not present

## 2017-04-10 DIAGNOSIS — F0150 Vascular dementia without behavioral disturbance: Secondary | ICD-10-CM | POA: Diagnosis not present

## 2017-04-11 DIAGNOSIS — Z8679 Personal history of other diseases of the circulatory system: Secondary | ICD-10-CM | POA: Diagnosis not present

## 2017-04-11 DIAGNOSIS — Z8739 Personal history of other diseases of the musculoskeletal system and connective tissue: Secondary | ICD-10-CM | POA: Diagnosis not present

## 2017-04-11 DIAGNOSIS — I639 Cerebral infarction, unspecified: Secondary | ICD-10-CM | POA: Diagnosis not present

## 2017-04-11 DIAGNOSIS — F0150 Vascular dementia without behavioral disturbance: Secondary | ICD-10-CM | POA: Diagnosis not present

## 2017-04-12 DIAGNOSIS — Z8679 Personal history of other diseases of the circulatory system: Secondary | ICD-10-CM | POA: Diagnosis not present

## 2017-04-12 DIAGNOSIS — F0150 Vascular dementia without behavioral disturbance: Secondary | ICD-10-CM | POA: Diagnosis not present

## 2017-04-12 DIAGNOSIS — I639 Cerebral infarction, unspecified: Secondary | ICD-10-CM | POA: Diagnosis not present

## 2017-04-12 DIAGNOSIS — Z8739 Personal history of other diseases of the musculoskeletal system and connective tissue: Secondary | ICD-10-CM | POA: Diagnosis not present

## 2017-04-13 DIAGNOSIS — Z8739 Personal history of other diseases of the musculoskeletal system and connective tissue: Secondary | ICD-10-CM | POA: Diagnosis not present

## 2017-04-13 DIAGNOSIS — Z8679 Personal history of other diseases of the circulatory system: Secondary | ICD-10-CM | POA: Diagnosis not present

## 2017-04-13 DIAGNOSIS — I639 Cerebral infarction, unspecified: Secondary | ICD-10-CM | POA: Diagnosis not present

## 2017-04-13 DIAGNOSIS — F0150 Vascular dementia without behavioral disturbance: Secondary | ICD-10-CM | POA: Diagnosis not present

## 2017-04-14 DIAGNOSIS — Z8679 Personal history of other diseases of the circulatory system: Secondary | ICD-10-CM | POA: Diagnosis not present

## 2017-04-14 DIAGNOSIS — F0150 Vascular dementia without behavioral disturbance: Secondary | ICD-10-CM | POA: Diagnosis not present

## 2017-04-14 DIAGNOSIS — I639 Cerebral infarction, unspecified: Secondary | ICD-10-CM | POA: Diagnosis not present

## 2017-04-14 DIAGNOSIS — Z8739 Personal history of other diseases of the musculoskeletal system and connective tissue: Secondary | ICD-10-CM | POA: Diagnosis not present

## 2017-04-15 DIAGNOSIS — Z8679 Personal history of other diseases of the circulatory system: Secondary | ICD-10-CM | POA: Diagnosis not present

## 2017-04-15 DIAGNOSIS — F0150 Vascular dementia without behavioral disturbance: Secondary | ICD-10-CM | POA: Diagnosis not present

## 2017-04-15 DIAGNOSIS — I639 Cerebral infarction, unspecified: Secondary | ICD-10-CM | POA: Diagnosis not present

## 2017-04-15 DIAGNOSIS — Z8739 Personal history of other diseases of the musculoskeletal system and connective tissue: Secondary | ICD-10-CM | POA: Diagnosis not present

## 2017-04-16 DIAGNOSIS — F0150 Vascular dementia without behavioral disturbance: Secondary | ICD-10-CM | POA: Diagnosis not present

## 2017-04-16 DIAGNOSIS — Z8679 Personal history of other diseases of the circulatory system: Secondary | ICD-10-CM | POA: Diagnosis not present

## 2017-04-16 DIAGNOSIS — Z8739 Personal history of other diseases of the musculoskeletal system and connective tissue: Secondary | ICD-10-CM | POA: Diagnosis not present

## 2017-04-16 DIAGNOSIS — I639 Cerebral infarction, unspecified: Secondary | ICD-10-CM | POA: Diagnosis not present

## 2017-04-17 DIAGNOSIS — F0150 Vascular dementia without behavioral disturbance: Secondary | ICD-10-CM | POA: Diagnosis not present

## 2017-04-17 DIAGNOSIS — Z8679 Personal history of other diseases of the circulatory system: Secondary | ICD-10-CM | POA: Diagnosis not present

## 2017-04-17 DIAGNOSIS — I639 Cerebral infarction, unspecified: Secondary | ICD-10-CM | POA: Diagnosis not present

## 2017-04-17 DIAGNOSIS — Z8739 Personal history of other diseases of the musculoskeletal system and connective tissue: Secondary | ICD-10-CM | POA: Diagnosis not present

## 2017-04-18 DIAGNOSIS — F0150 Vascular dementia without behavioral disturbance: Secondary | ICD-10-CM | POA: Diagnosis not present

## 2017-04-18 DIAGNOSIS — I639 Cerebral infarction, unspecified: Secondary | ICD-10-CM | POA: Diagnosis not present

## 2017-04-18 DIAGNOSIS — Z8679 Personal history of other diseases of the circulatory system: Secondary | ICD-10-CM | POA: Diagnosis not present

## 2017-04-18 DIAGNOSIS — Z8739 Personal history of other diseases of the musculoskeletal system and connective tissue: Secondary | ICD-10-CM | POA: Diagnosis not present

## 2017-04-19 DIAGNOSIS — Z8739 Personal history of other diseases of the musculoskeletal system and connective tissue: Secondary | ICD-10-CM | POA: Diagnosis not present

## 2017-04-19 DIAGNOSIS — Z8679 Personal history of other diseases of the circulatory system: Secondary | ICD-10-CM | POA: Diagnosis not present

## 2017-04-19 DIAGNOSIS — F0150 Vascular dementia without behavioral disturbance: Secondary | ICD-10-CM | POA: Diagnosis not present

## 2017-04-19 DIAGNOSIS — I639 Cerebral infarction, unspecified: Secondary | ICD-10-CM | POA: Diagnosis not present

## 2017-04-20 DIAGNOSIS — I639 Cerebral infarction, unspecified: Secondary | ICD-10-CM | POA: Diagnosis not present

## 2017-04-20 DIAGNOSIS — Z8679 Personal history of other diseases of the circulatory system: Secondary | ICD-10-CM | POA: Diagnosis not present

## 2017-04-20 DIAGNOSIS — Z8739 Personal history of other diseases of the musculoskeletal system and connective tissue: Secondary | ICD-10-CM | POA: Diagnosis not present

## 2017-04-20 DIAGNOSIS — F0150 Vascular dementia without behavioral disturbance: Secondary | ICD-10-CM | POA: Diagnosis not present

## 2017-04-21 DIAGNOSIS — Z8739 Personal history of other diseases of the musculoskeletal system and connective tissue: Secondary | ICD-10-CM | POA: Diagnosis not present

## 2017-04-21 DIAGNOSIS — I639 Cerebral infarction, unspecified: Secondary | ICD-10-CM | POA: Diagnosis not present

## 2017-04-21 DIAGNOSIS — F0150 Vascular dementia without behavioral disturbance: Secondary | ICD-10-CM | POA: Diagnosis not present

## 2017-04-21 DIAGNOSIS — Z8679 Personal history of other diseases of the circulatory system: Secondary | ICD-10-CM | POA: Diagnosis not present

## 2017-04-22 DIAGNOSIS — F0150 Vascular dementia without behavioral disturbance: Secondary | ICD-10-CM | POA: Diagnosis not present

## 2017-04-22 DIAGNOSIS — Z8739 Personal history of other diseases of the musculoskeletal system and connective tissue: Secondary | ICD-10-CM | POA: Diagnosis not present

## 2017-04-22 DIAGNOSIS — Z8679 Personal history of other diseases of the circulatory system: Secondary | ICD-10-CM | POA: Diagnosis not present

## 2017-04-22 DIAGNOSIS — I639 Cerebral infarction, unspecified: Secondary | ICD-10-CM | POA: Diagnosis not present

## 2017-04-23 DIAGNOSIS — Z8739 Personal history of other diseases of the musculoskeletal system and connective tissue: Secondary | ICD-10-CM | POA: Diagnosis not present

## 2017-04-23 DIAGNOSIS — Z8679 Personal history of other diseases of the circulatory system: Secondary | ICD-10-CM | POA: Diagnosis not present

## 2017-04-23 DIAGNOSIS — I639 Cerebral infarction, unspecified: Secondary | ICD-10-CM | POA: Diagnosis not present

## 2017-04-23 DIAGNOSIS — F0150 Vascular dementia without behavioral disturbance: Secondary | ICD-10-CM | POA: Diagnosis not present

## 2017-04-24 DIAGNOSIS — I639 Cerebral infarction, unspecified: Secondary | ICD-10-CM | POA: Diagnosis not present

## 2017-04-24 DIAGNOSIS — Z8739 Personal history of other diseases of the musculoskeletal system and connective tissue: Secondary | ICD-10-CM | POA: Diagnosis not present

## 2017-04-24 DIAGNOSIS — Z8679 Personal history of other diseases of the circulatory system: Secondary | ICD-10-CM | POA: Diagnosis not present

## 2017-04-24 DIAGNOSIS — F0150 Vascular dementia without behavioral disturbance: Secondary | ICD-10-CM | POA: Diagnosis not present

## 2017-04-25 DIAGNOSIS — F0150 Vascular dementia without behavioral disturbance: Secondary | ICD-10-CM | POA: Diagnosis not present

## 2017-04-25 DIAGNOSIS — I639 Cerebral infarction, unspecified: Secondary | ICD-10-CM | POA: Diagnosis not present

## 2017-04-25 DIAGNOSIS — Z8679 Personal history of other diseases of the circulatory system: Secondary | ICD-10-CM | POA: Diagnosis not present

## 2017-04-25 DIAGNOSIS — Z8739 Personal history of other diseases of the musculoskeletal system and connective tissue: Secondary | ICD-10-CM | POA: Diagnosis not present

## 2017-04-26 DIAGNOSIS — F0150 Vascular dementia without behavioral disturbance: Secondary | ICD-10-CM | POA: Diagnosis not present

## 2017-04-26 DIAGNOSIS — I639 Cerebral infarction, unspecified: Secondary | ICD-10-CM | POA: Diagnosis not present

## 2017-04-26 DIAGNOSIS — Z8739 Personal history of other diseases of the musculoskeletal system and connective tissue: Secondary | ICD-10-CM | POA: Diagnosis not present

## 2017-04-26 DIAGNOSIS — Z8679 Personal history of other diseases of the circulatory system: Secondary | ICD-10-CM | POA: Diagnosis not present

## 2017-04-27 DIAGNOSIS — I639 Cerebral infarction, unspecified: Secondary | ICD-10-CM | POA: Diagnosis not present

## 2017-04-27 DIAGNOSIS — Z8739 Personal history of other diseases of the musculoskeletal system and connective tissue: Secondary | ICD-10-CM | POA: Diagnosis not present

## 2017-04-27 DIAGNOSIS — F0150 Vascular dementia without behavioral disturbance: Secondary | ICD-10-CM | POA: Diagnosis not present

## 2017-04-27 DIAGNOSIS — Z8679 Personal history of other diseases of the circulatory system: Secondary | ICD-10-CM | POA: Diagnosis not present

## 2017-04-28 DIAGNOSIS — Z8679 Personal history of other diseases of the circulatory system: Secondary | ICD-10-CM | POA: Diagnosis not present

## 2017-04-28 DIAGNOSIS — F0150 Vascular dementia without behavioral disturbance: Secondary | ICD-10-CM | POA: Diagnosis not present

## 2017-04-28 DIAGNOSIS — I639 Cerebral infarction, unspecified: Secondary | ICD-10-CM | POA: Diagnosis not present

## 2017-04-28 DIAGNOSIS — Z8739 Personal history of other diseases of the musculoskeletal system and connective tissue: Secondary | ICD-10-CM | POA: Diagnosis not present

## 2017-04-29 DIAGNOSIS — Z8739 Personal history of other diseases of the musculoskeletal system and connective tissue: Secondary | ICD-10-CM | POA: Diagnosis not present

## 2017-04-29 DIAGNOSIS — Z8679 Personal history of other diseases of the circulatory system: Secondary | ICD-10-CM | POA: Diagnosis not present

## 2017-04-29 DIAGNOSIS — F0150 Vascular dementia without behavioral disturbance: Secondary | ICD-10-CM | POA: Diagnosis not present

## 2017-04-29 DIAGNOSIS — I639 Cerebral infarction, unspecified: Secondary | ICD-10-CM | POA: Diagnosis not present

## 2017-04-30 DIAGNOSIS — I639 Cerebral infarction, unspecified: Secondary | ICD-10-CM | POA: Diagnosis not present

## 2017-04-30 DIAGNOSIS — Z8739 Personal history of other diseases of the musculoskeletal system and connective tissue: Secondary | ICD-10-CM | POA: Diagnosis not present

## 2017-04-30 DIAGNOSIS — F0150 Vascular dementia without behavioral disturbance: Secondary | ICD-10-CM | POA: Diagnosis not present

## 2017-04-30 DIAGNOSIS — Z8679 Personal history of other diseases of the circulatory system: Secondary | ICD-10-CM | POA: Diagnosis not present

## 2017-05-01 DIAGNOSIS — Z8739 Personal history of other diseases of the musculoskeletal system and connective tissue: Secondary | ICD-10-CM | POA: Diagnosis not present

## 2017-05-01 DIAGNOSIS — Z8679 Personal history of other diseases of the circulatory system: Secondary | ICD-10-CM | POA: Diagnosis not present

## 2017-05-01 DIAGNOSIS — I639 Cerebral infarction, unspecified: Secondary | ICD-10-CM | POA: Diagnosis not present

## 2017-05-01 DIAGNOSIS — F0150 Vascular dementia without behavioral disturbance: Secondary | ICD-10-CM | POA: Diagnosis not present

## 2017-05-02 DIAGNOSIS — Z8679 Personal history of other diseases of the circulatory system: Secondary | ICD-10-CM | POA: Diagnosis not present

## 2017-05-02 DIAGNOSIS — I639 Cerebral infarction, unspecified: Secondary | ICD-10-CM | POA: Diagnosis not present

## 2017-05-02 DIAGNOSIS — Z8739 Personal history of other diseases of the musculoskeletal system and connective tissue: Secondary | ICD-10-CM | POA: Diagnosis not present

## 2017-05-02 DIAGNOSIS — F0150 Vascular dementia without behavioral disturbance: Secondary | ICD-10-CM | POA: Diagnosis not present

## 2017-05-03 DIAGNOSIS — F0150 Vascular dementia without behavioral disturbance: Secondary | ICD-10-CM | POA: Diagnosis not present

## 2017-05-03 DIAGNOSIS — I639 Cerebral infarction, unspecified: Secondary | ICD-10-CM | POA: Diagnosis not present

## 2017-05-03 DIAGNOSIS — Z8739 Personal history of other diseases of the musculoskeletal system and connective tissue: Secondary | ICD-10-CM | POA: Diagnosis not present

## 2017-05-03 DIAGNOSIS — Z8679 Personal history of other diseases of the circulatory system: Secondary | ICD-10-CM | POA: Diagnosis not present

## 2017-05-04 DIAGNOSIS — F0150 Vascular dementia without behavioral disturbance: Secondary | ICD-10-CM | POA: Diagnosis not present

## 2017-05-04 DIAGNOSIS — Z8739 Personal history of other diseases of the musculoskeletal system and connective tissue: Secondary | ICD-10-CM | POA: Diagnosis not present

## 2017-05-04 DIAGNOSIS — Z8679 Personal history of other diseases of the circulatory system: Secondary | ICD-10-CM | POA: Diagnosis not present

## 2017-05-04 DIAGNOSIS — I639 Cerebral infarction, unspecified: Secondary | ICD-10-CM | POA: Diagnosis not present

## 2017-05-05 DIAGNOSIS — Z8739 Personal history of other diseases of the musculoskeletal system and connective tissue: Secondary | ICD-10-CM | POA: Diagnosis not present

## 2017-05-05 DIAGNOSIS — Z8679 Personal history of other diseases of the circulatory system: Secondary | ICD-10-CM | POA: Diagnosis not present

## 2017-05-05 DIAGNOSIS — F0150 Vascular dementia without behavioral disturbance: Secondary | ICD-10-CM | POA: Diagnosis not present

## 2017-05-05 DIAGNOSIS — I639 Cerebral infarction, unspecified: Secondary | ICD-10-CM | POA: Diagnosis not present

## 2017-05-06 DIAGNOSIS — Z8739 Personal history of other diseases of the musculoskeletal system and connective tissue: Secondary | ICD-10-CM | POA: Diagnosis not present

## 2017-05-06 DIAGNOSIS — Z8679 Personal history of other diseases of the circulatory system: Secondary | ICD-10-CM | POA: Diagnosis not present

## 2017-05-06 DIAGNOSIS — F0150 Vascular dementia without behavioral disturbance: Secondary | ICD-10-CM | POA: Diagnosis not present

## 2017-05-06 DIAGNOSIS — I639 Cerebral infarction, unspecified: Secondary | ICD-10-CM | POA: Diagnosis not present

## 2017-05-07 DIAGNOSIS — Z8739 Personal history of other diseases of the musculoskeletal system and connective tissue: Secondary | ICD-10-CM | POA: Diagnosis not present

## 2017-05-07 DIAGNOSIS — F0150 Vascular dementia without behavioral disturbance: Secondary | ICD-10-CM | POA: Diagnosis not present

## 2017-05-07 DIAGNOSIS — Z8679 Personal history of other diseases of the circulatory system: Secondary | ICD-10-CM | POA: Diagnosis not present

## 2017-05-07 DIAGNOSIS — I639 Cerebral infarction, unspecified: Secondary | ICD-10-CM | POA: Diagnosis not present

## 2017-05-08 DIAGNOSIS — Z8739 Personal history of other diseases of the musculoskeletal system and connective tissue: Secondary | ICD-10-CM | POA: Diagnosis not present

## 2017-05-08 DIAGNOSIS — Z8679 Personal history of other diseases of the circulatory system: Secondary | ICD-10-CM | POA: Diagnosis not present

## 2017-05-08 DIAGNOSIS — I639 Cerebral infarction, unspecified: Secondary | ICD-10-CM | POA: Diagnosis not present

## 2017-05-08 DIAGNOSIS — F0150 Vascular dementia without behavioral disturbance: Secondary | ICD-10-CM | POA: Diagnosis not present

## 2017-05-09 DIAGNOSIS — Z8679 Personal history of other diseases of the circulatory system: Secondary | ICD-10-CM | POA: Diagnosis not present

## 2017-05-09 DIAGNOSIS — Z8739 Personal history of other diseases of the musculoskeletal system and connective tissue: Secondary | ICD-10-CM | POA: Diagnosis not present

## 2017-05-09 DIAGNOSIS — F0150 Vascular dementia without behavioral disturbance: Secondary | ICD-10-CM | POA: Diagnosis not present

## 2017-05-09 DIAGNOSIS — I639 Cerebral infarction, unspecified: Secondary | ICD-10-CM | POA: Diagnosis not present

## 2017-05-10 DIAGNOSIS — Z8679 Personal history of other diseases of the circulatory system: Secondary | ICD-10-CM | POA: Diagnosis not present

## 2017-05-10 DIAGNOSIS — F0150 Vascular dementia without behavioral disturbance: Secondary | ICD-10-CM | POA: Diagnosis not present

## 2017-05-10 DIAGNOSIS — Z8739 Personal history of other diseases of the musculoskeletal system and connective tissue: Secondary | ICD-10-CM | POA: Diagnosis not present

## 2017-05-10 DIAGNOSIS — I639 Cerebral infarction, unspecified: Secondary | ICD-10-CM | POA: Diagnosis not present

## 2017-05-11 DIAGNOSIS — Z8679 Personal history of other diseases of the circulatory system: Secondary | ICD-10-CM | POA: Diagnosis not present

## 2017-05-11 DIAGNOSIS — I639 Cerebral infarction, unspecified: Secondary | ICD-10-CM | POA: Diagnosis not present

## 2017-05-11 DIAGNOSIS — F0150 Vascular dementia without behavioral disturbance: Secondary | ICD-10-CM | POA: Diagnosis not present

## 2017-05-11 DIAGNOSIS — Z8739 Personal history of other diseases of the musculoskeletal system and connective tissue: Secondary | ICD-10-CM | POA: Diagnosis not present

## 2017-05-12 DIAGNOSIS — Z8739 Personal history of other diseases of the musculoskeletal system and connective tissue: Secondary | ICD-10-CM | POA: Diagnosis not present

## 2017-05-12 DIAGNOSIS — Z8679 Personal history of other diseases of the circulatory system: Secondary | ICD-10-CM | POA: Diagnosis not present

## 2017-05-12 DIAGNOSIS — I639 Cerebral infarction, unspecified: Secondary | ICD-10-CM | POA: Diagnosis not present

## 2017-05-12 DIAGNOSIS — F0150 Vascular dementia without behavioral disturbance: Secondary | ICD-10-CM | POA: Diagnosis not present

## 2017-05-13 DIAGNOSIS — F0150 Vascular dementia without behavioral disturbance: Secondary | ICD-10-CM | POA: Diagnosis not present

## 2017-05-13 DIAGNOSIS — Z8739 Personal history of other diseases of the musculoskeletal system and connective tissue: Secondary | ICD-10-CM | POA: Diagnosis not present

## 2017-05-13 DIAGNOSIS — Z8679 Personal history of other diseases of the circulatory system: Secondary | ICD-10-CM | POA: Diagnosis not present

## 2017-05-13 DIAGNOSIS — I639 Cerebral infarction, unspecified: Secondary | ICD-10-CM | POA: Diagnosis not present

## 2017-05-14 DIAGNOSIS — F0150 Vascular dementia without behavioral disturbance: Secondary | ICD-10-CM | POA: Diagnosis not present

## 2017-05-14 DIAGNOSIS — Z8739 Personal history of other diseases of the musculoskeletal system and connective tissue: Secondary | ICD-10-CM | POA: Diagnosis not present

## 2017-05-14 DIAGNOSIS — Z8679 Personal history of other diseases of the circulatory system: Secondary | ICD-10-CM | POA: Diagnosis not present

## 2017-05-14 DIAGNOSIS — I639 Cerebral infarction, unspecified: Secondary | ICD-10-CM | POA: Diagnosis not present

## 2017-05-15 DIAGNOSIS — I639 Cerebral infarction, unspecified: Secondary | ICD-10-CM | POA: Diagnosis not present

## 2017-05-15 DIAGNOSIS — Z8739 Personal history of other diseases of the musculoskeletal system and connective tissue: Secondary | ICD-10-CM | POA: Diagnosis not present

## 2017-05-15 DIAGNOSIS — F0150 Vascular dementia without behavioral disturbance: Secondary | ICD-10-CM | POA: Diagnosis not present

## 2017-05-15 DIAGNOSIS — Z8679 Personal history of other diseases of the circulatory system: Secondary | ICD-10-CM | POA: Diagnosis not present

## 2017-05-16 DIAGNOSIS — Z8739 Personal history of other diseases of the musculoskeletal system and connective tissue: Secondary | ICD-10-CM | POA: Diagnosis not present

## 2017-05-16 DIAGNOSIS — F0150 Vascular dementia without behavioral disturbance: Secondary | ICD-10-CM | POA: Diagnosis not present

## 2017-05-16 DIAGNOSIS — I639 Cerebral infarction, unspecified: Secondary | ICD-10-CM | POA: Diagnosis not present

## 2017-05-16 DIAGNOSIS — Z8679 Personal history of other diseases of the circulatory system: Secondary | ICD-10-CM | POA: Diagnosis not present

## 2017-05-17 DIAGNOSIS — Z8739 Personal history of other diseases of the musculoskeletal system and connective tissue: Secondary | ICD-10-CM | POA: Diagnosis not present

## 2017-05-17 DIAGNOSIS — F0150 Vascular dementia without behavioral disturbance: Secondary | ICD-10-CM | POA: Diagnosis not present

## 2017-05-17 DIAGNOSIS — I639 Cerebral infarction, unspecified: Secondary | ICD-10-CM | POA: Diagnosis not present

## 2017-05-17 DIAGNOSIS — Z8679 Personal history of other diseases of the circulatory system: Secondary | ICD-10-CM | POA: Diagnosis not present

## 2017-05-18 DIAGNOSIS — I639 Cerebral infarction, unspecified: Secondary | ICD-10-CM | POA: Diagnosis not present

## 2017-05-18 DIAGNOSIS — Z8739 Personal history of other diseases of the musculoskeletal system and connective tissue: Secondary | ICD-10-CM | POA: Diagnosis not present

## 2017-05-18 DIAGNOSIS — Z8679 Personal history of other diseases of the circulatory system: Secondary | ICD-10-CM | POA: Diagnosis not present

## 2017-05-18 DIAGNOSIS — F0150 Vascular dementia without behavioral disturbance: Secondary | ICD-10-CM | POA: Diagnosis not present

## 2017-05-19 DIAGNOSIS — Z8679 Personal history of other diseases of the circulatory system: Secondary | ICD-10-CM | POA: Diagnosis not present

## 2017-05-19 DIAGNOSIS — I639 Cerebral infarction, unspecified: Secondary | ICD-10-CM | POA: Diagnosis not present

## 2017-05-19 DIAGNOSIS — F0150 Vascular dementia without behavioral disturbance: Secondary | ICD-10-CM | POA: Diagnosis not present

## 2017-05-19 DIAGNOSIS — Z8739 Personal history of other diseases of the musculoskeletal system and connective tissue: Secondary | ICD-10-CM | POA: Diagnosis not present

## 2017-05-30 DEATH — deceased

## 2017-10-16 IMAGING — MR MR HEAD W/O CM
8 of 10 series · 38 of 48 positions shown · non-contrast
Comparison: CT HEAD March 11, 2017 at 6959 hours

CLINICAL DATA: Altered mental status. History of hypertension and
dementia. Assess for stroke.

EXAM:
MRI HEAD WITHOUT CONTRAST
TECHNIQUE: Multiplanar, multiecho pulse sequences of the brain and surrounding
structures were obtained without intravenous contrast.

[Series 4: DWI · axial · 3.0mm · 1.09mm/px · z∈[-114,+9]mm · 8 of 86 slices shown (1 of 4)]
[im 1/86]
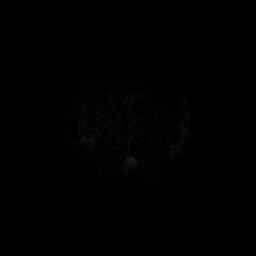
[im 10/86]
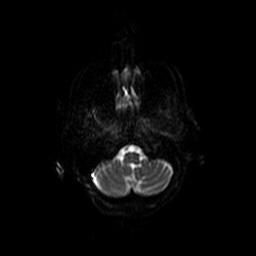
[im 29/86]
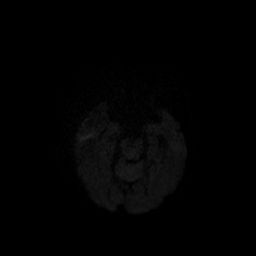
[im 38/86]
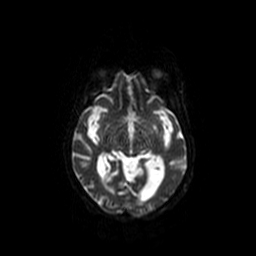
[im 48/86]
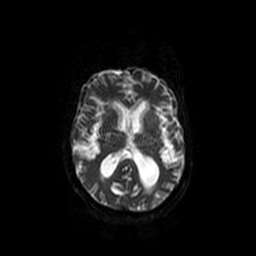
[im 57/86]
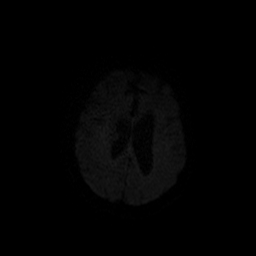
[im 76/86]
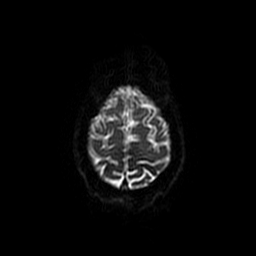
[im 86/86]
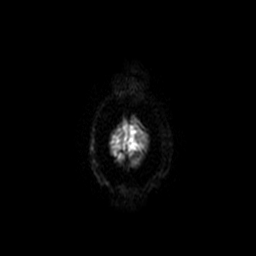

[Series 5: DWI · coronal · 5.0mm · 1.09mm/px · 9 of 66 slices shown (2 of 4)]
[im 1/66]
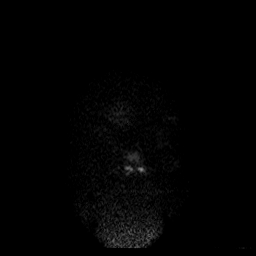
[im 9/66]
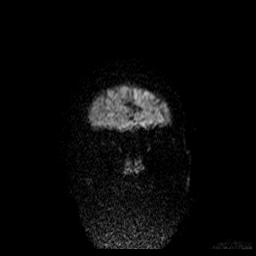
[im 17/66]
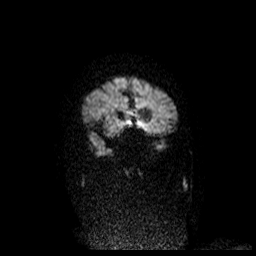
[im 25/66]
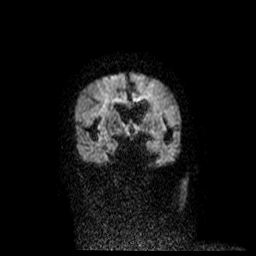
[im 33/66]
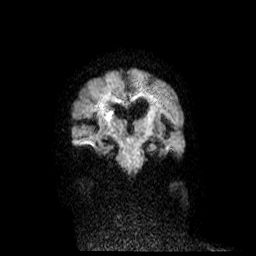
[im 41/66]
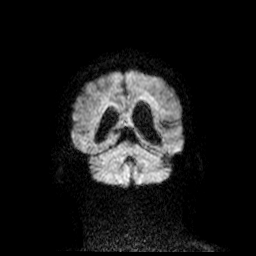
[im 49/66]
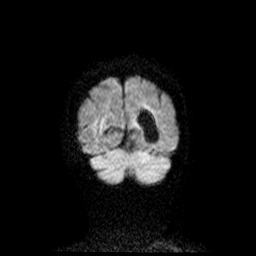
[im 57/66]
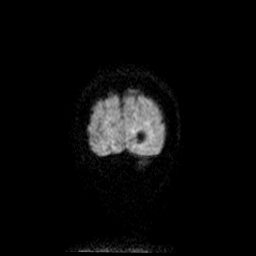
[im 66/66]
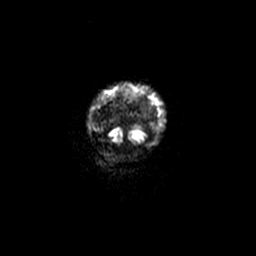

[Series 6: T2 · axial · 5.0mm · 0.47mm/px · z∈[-109,+15]mm · 3 of 22 slices shown (1 of 2)]
[im 1/22]
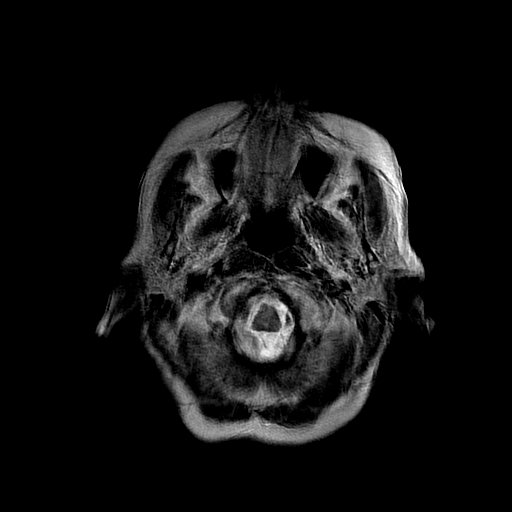
[im 11/22]
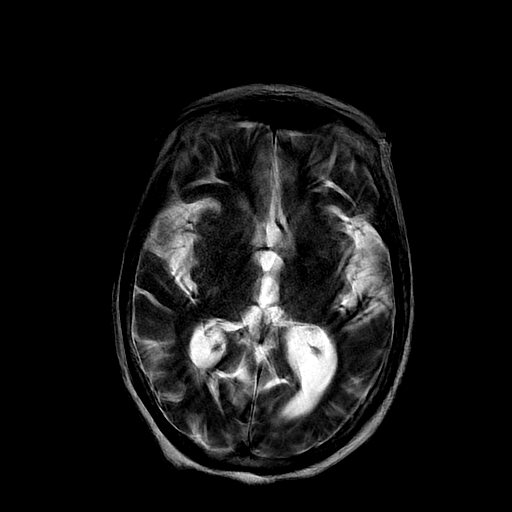
[im 22/22]
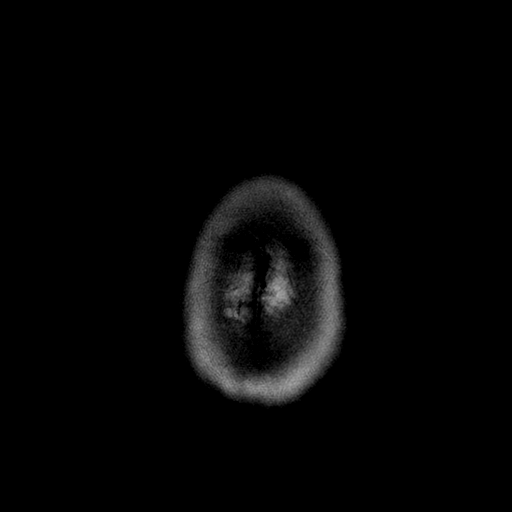

[Series 8: FLAIR · axial · 5.0mm · 0.47mm/px · z∈[-109,+15]mm · 3 of 22 slices shown (1 of 2)]
[im 1/22]
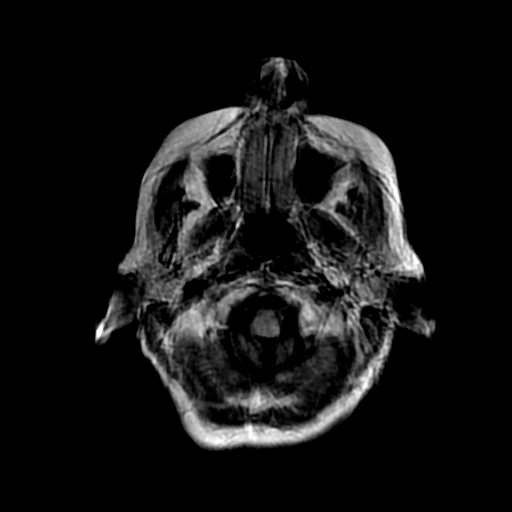
[im 11/22]
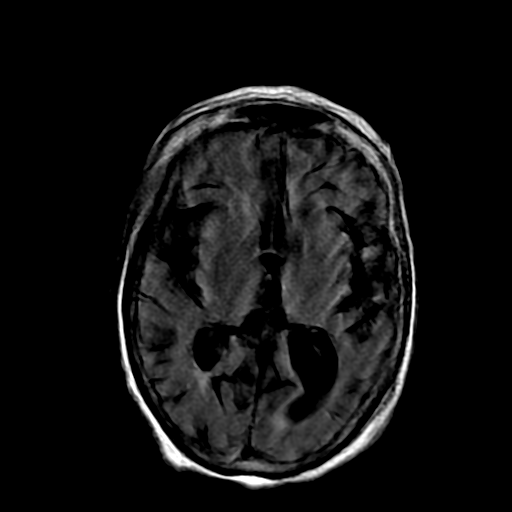
[im 22/22]
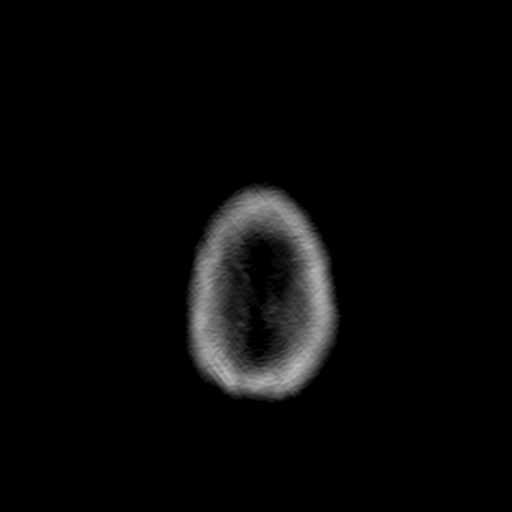

[Series 10: FLAIR · axial · 5.0mm · 0.47mm/px · z∈[-109,+15]mm · 3 of 22 slices shown (2 of 2)]
[im 1/22]
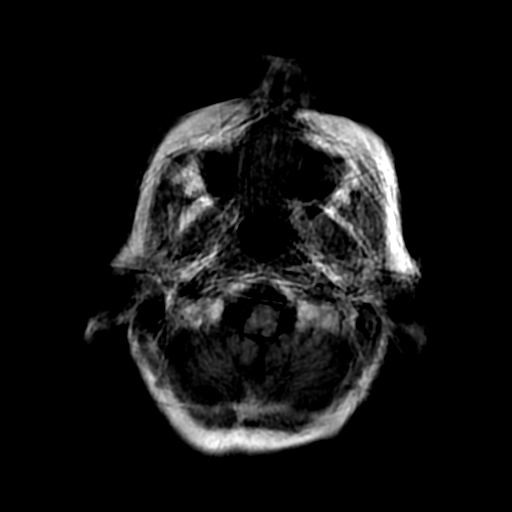
[im 11/22]
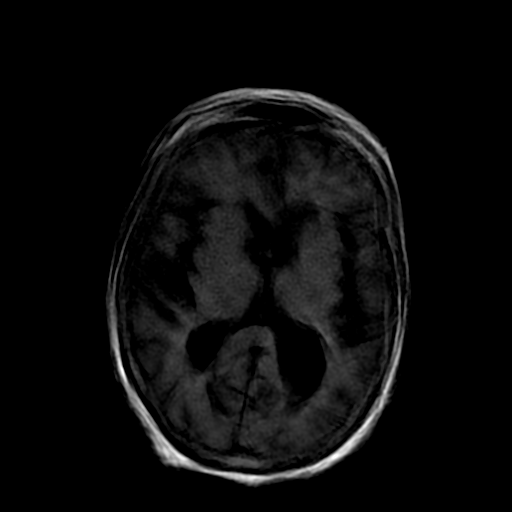
[im 22/22]
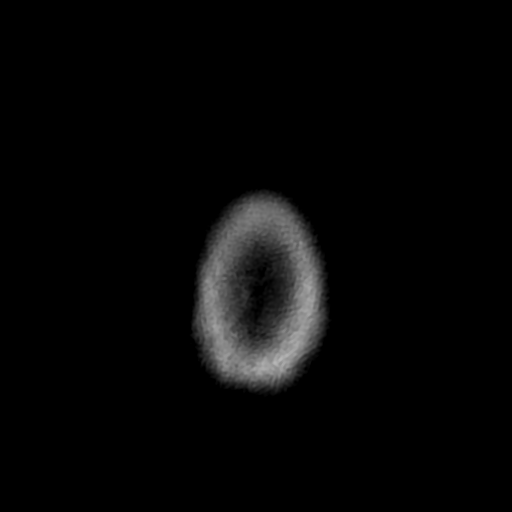

[Series 11: T2 · coronal · 5.0mm · 0.43mm/px · 2 of 28 slices shown (2 of 2)]
[im 1/28]
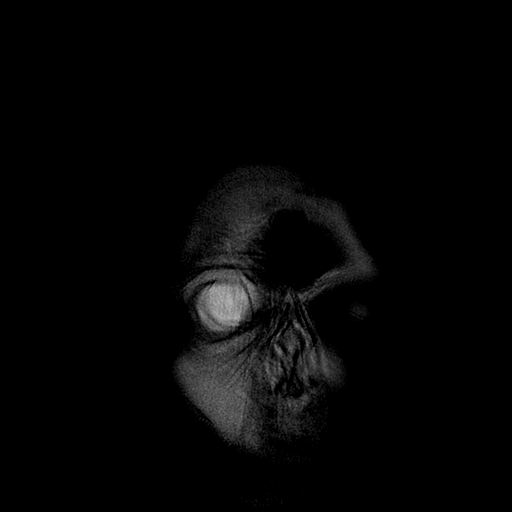
[im 10/28]
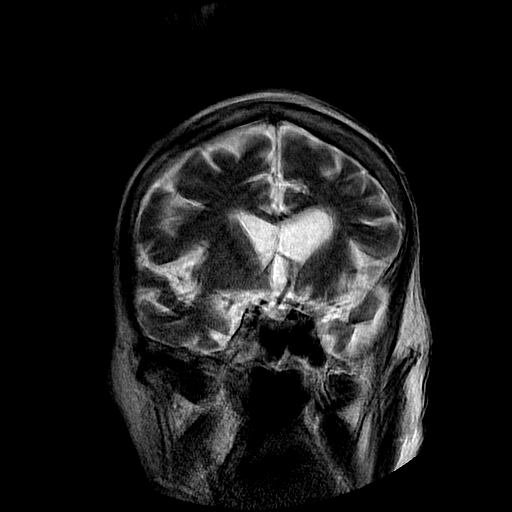

[Series 400: DWI · axial · 3.0mm · 1.09mm/px · z∈[-114,+9]mm · 6 of 43 slices shown (3 of 4)]
[im 1/43]
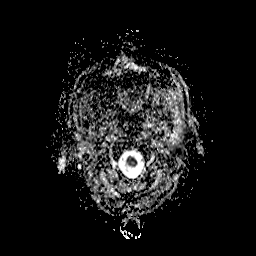
[im 9/43]
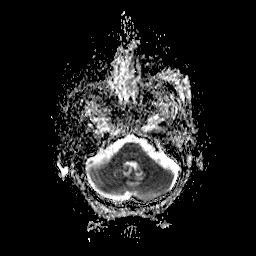
[im 17/43]
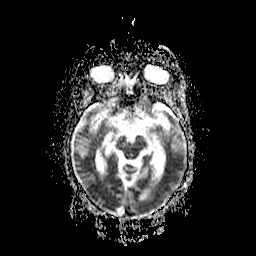
[im 26/43]
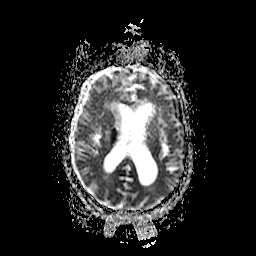
[im 34/43]
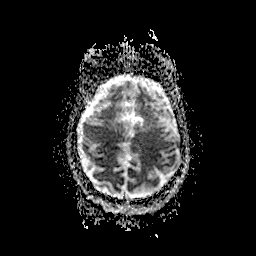
[im 43/43]
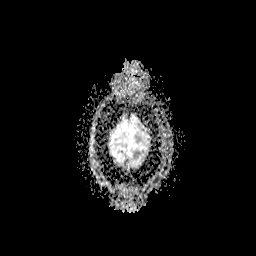

[Series 500: DWI · coronal · 5.0mm · 1.09mm/px · 4 of 33 slices shown (4 of 4)]
[im 1/33]
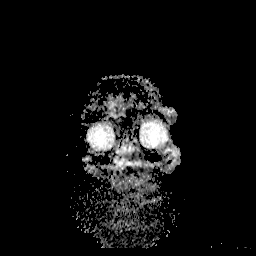
[im 11/33]
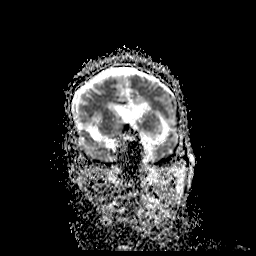
[im 22/33]
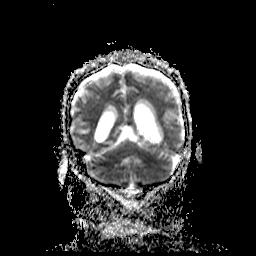
[im 33/33]
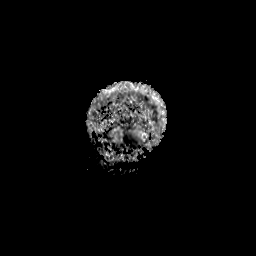

[38 of 48 positions shown; findings below may reference images not displayed]

FINDINGS: Sequences are moderately or severely motion degraded.

BRAIN: 19 x 7 mm reduced diffusion RIGHT basal ganglia to internal
capsule with low ADC values. No susceptibility artifact to suggest
lobar hematoma though motion degrades sensitivity for micro
hemorrhages. Moderate ventriculomegaly on the basis of global
parenchymal brain volume loss. Patchy supratentorial white matter
FLAIR T2 hyperintensities. No midline shift, mass effect or definite
masses though limited by patient motion. No abnormal extra-axial
fluid collections.

VASCULAR: Major intracranial vascular flow voids present at skull
base.

SKULL AND UPPER CERVICAL SPINE: Limited assessment due to motion. No
cerebellar tonsillar ectopia.

SINUSES/ORBITS: Limited examination. No paranasal sinus air-fluid
levels.

OTHER: None.
IMPRESSION: 1. Moderate to severely motion degraded examination.
2. Acute 19 x 7 mm RIGHT basal ganglia to internal capsule infarct.
3. Moderate atrophy in and at at least mild to moderate chronic
small vessel ischemic disease.
